# Patient Record
Sex: Male | Born: 1975 | Race: White | Hispanic: No | Marital: Single | Smoking: Light tobacco smoker
Health system: Southern US, Community
[De-identification: ages and names within clinical notes are randomized; demographics above are authoritative.]

## PROBLEM LIST (undated history)

## (undated) DIAGNOSIS — I10 Essential (primary) hypertension: Secondary | ICD-10-CM

## (undated) DIAGNOSIS — K219 Gastro-esophageal reflux disease without esophagitis: Secondary | ICD-10-CM

## (undated) HISTORY — PX: CHOLECYSTECTOMY: SHX55

---

## 2004-07-07 ENCOUNTER — Emergency Department: Payer: Self-pay | Admitting: Emergency Medicine

## 2005-07-14 ENCOUNTER — Emergency Department: Payer: Self-pay | Admitting: Emergency Medicine

## 2006-08-02 ENCOUNTER — Emergency Department: Payer: Self-pay | Admitting: Internal Medicine

## 2008-01-20 ENCOUNTER — Other Ambulatory Visit: Payer: Self-pay

## 2008-01-20 ENCOUNTER — Emergency Department: Payer: Self-pay | Admitting: Emergency Medicine

## 2008-01-21 ENCOUNTER — Emergency Department: Payer: Self-pay

## 2008-01-23 ENCOUNTER — Other Ambulatory Visit: Payer: Self-pay

## 2008-01-23 ENCOUNTER — Emergency Department: Payer: Self-pay | Admitting: Emergency Medicine

## 2008-05-12 ENCOUNTER — Emergency Department: Payer: Self-pay | Admitting: Internal Medicine

## 2008-08-12 ENCOUNTER — Emergency Department: Payer: Self-pay | Admitting: Emergency Medicine

## 2008-09-23 ENCOUNTER — Emergency Department: Payer: Self-pay | Admitting: Emergency Medicine

## 2010-01-14 ENCOUNTER — Emergency Department: Payer: Self-pay | Admitting: Emergency Medicine

## 2010-02-05 ENCOUNTER — Emergency Department: Payer: Self-pay | Admitting: Emergency Medicine

## 2010-02-10 ENCOUNTER — Emergency Department (HOSPITAL_COMMUNITY): Admission: EM | Admit: 2010-02-10 | Discharge: 2010-02-10 | Payer: Self-pay | Admitting: Emergency Medicine

## 2010-02-24 ENCOUNTER — Emergency Department: Payer: Self-pay | Admitting: Emergency Medicine

## 2010-11-03 LAB — URINALYSIS, ROUTINE W REFLEX MICROSCOPIC
Glucose, UA: NEGATIVE mg/dL
Ketones, ur: NEGATIVE mg/dL
Leukocytes, UA: NEGATIVE
Nitrite: NEGATIVE
Specific Gravity, Urine: 1.01 (ref 1.005–1.030)
Urobilinogen, UA: 0.2 mg/dL (ref 0.0–1.0)

## 2010-11-03 LAB — URINE CULTURE

## 2010-11-17 ENCOUNTER — Emergency Department: Payer: Self-pay | Admitting: Emergency Medicine

## 2011-01-27 ENCOUNTER — Emergency Department: Payer: Self-pay | Admitting: Unknown Physician Specialty

## 2011-04-09 ENCOUNTER — Emergency Department: Payer: Self-pay | Admitting: *Deleted

## 2011-08-15 ENCOUNTER — Emergency Department: Payer: Self-pay | Admitting: *Deleted

## 2011-09-16 ENCOUNTER — Emergency Department: Payer: Self-pay | Admitting: Emergency Medicine

## 2011-10-26 ENCOUNTER — Emergency Department: Payer: Self-pay | Admitting: *Deleted

## 2012-05-10 ENCOUNTER — Emergency Department: Payer: Self-pay | Admitting: Emergency Medicine

## 2013-01-16 ENCOUNTER — Emergency Department: Payer: Self-pay | Admitting: Internal Medicine

## 2013-01-16 LAB — COMPREHENSIVE METABOLIC PANEL
Albumin: 4.1 g/dL (ref 3.4–5.0)
Alkaline Phosphatase: 92 U/L (ref 50–136)
Anion Gap: 5 — ABNORMAL LOW (ref 7–16)
BUN: 8 mg/dL (ref 7–18)
Bilirubin,Total: 0.4 mg/dL (ref 0.2–1.0)
Calcium, Total: 9.4 mg/dL (ref 8.5–10.1)
Chloride: 101 mmol/L (ref 98–107)
Co2: 29 mmol/L (ref 21–32)
EGFR (African American): >60
EGFR (Non-African Amer.): >60
Glucose: 89 mg/dL (ref 65–99)
Osmolality: 268 (ref 275–301)
SGPT (ALT): 33 U/L (ref 12–78)
Sodium: 135 mmol/L — ABNORMAL LOW (ref 136–145)
Total Protein: 7.8 g/dL (ref 6.4–8.2)

## 2013-01-16 LAB — CBC
HCT: 41.9 % (ref 40.0–52.0)
MCHC: 34.6 g/dL (ref 32.0–36.0)
WBC: 11 10*3/uL — ABNORMAL HIGH (ref 3.8–10.6)

## 2013-05-13 IMAGING — CR RIGHT TIBIA AND FIBULA - 2 VIEW
1 series · 3 of 3 positions shown · non-contrast
Comparison: none

REASON FOR EXAM: fall, pain
COMMENTS:

[Series 1: x tib-fib ap right · 0.14mm/px · 3 of 3 slices shown]
[im 1/3]
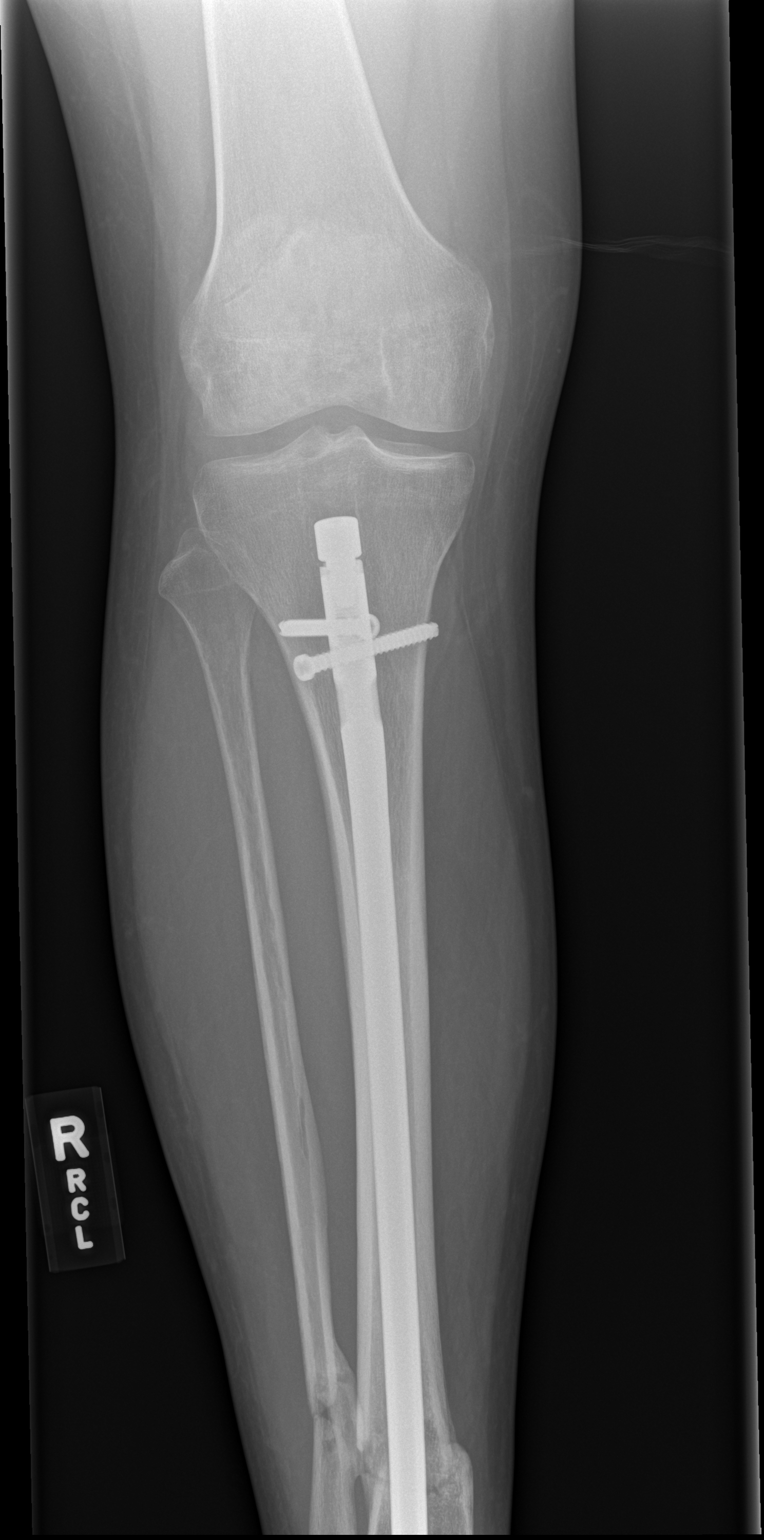
[im 2/3]
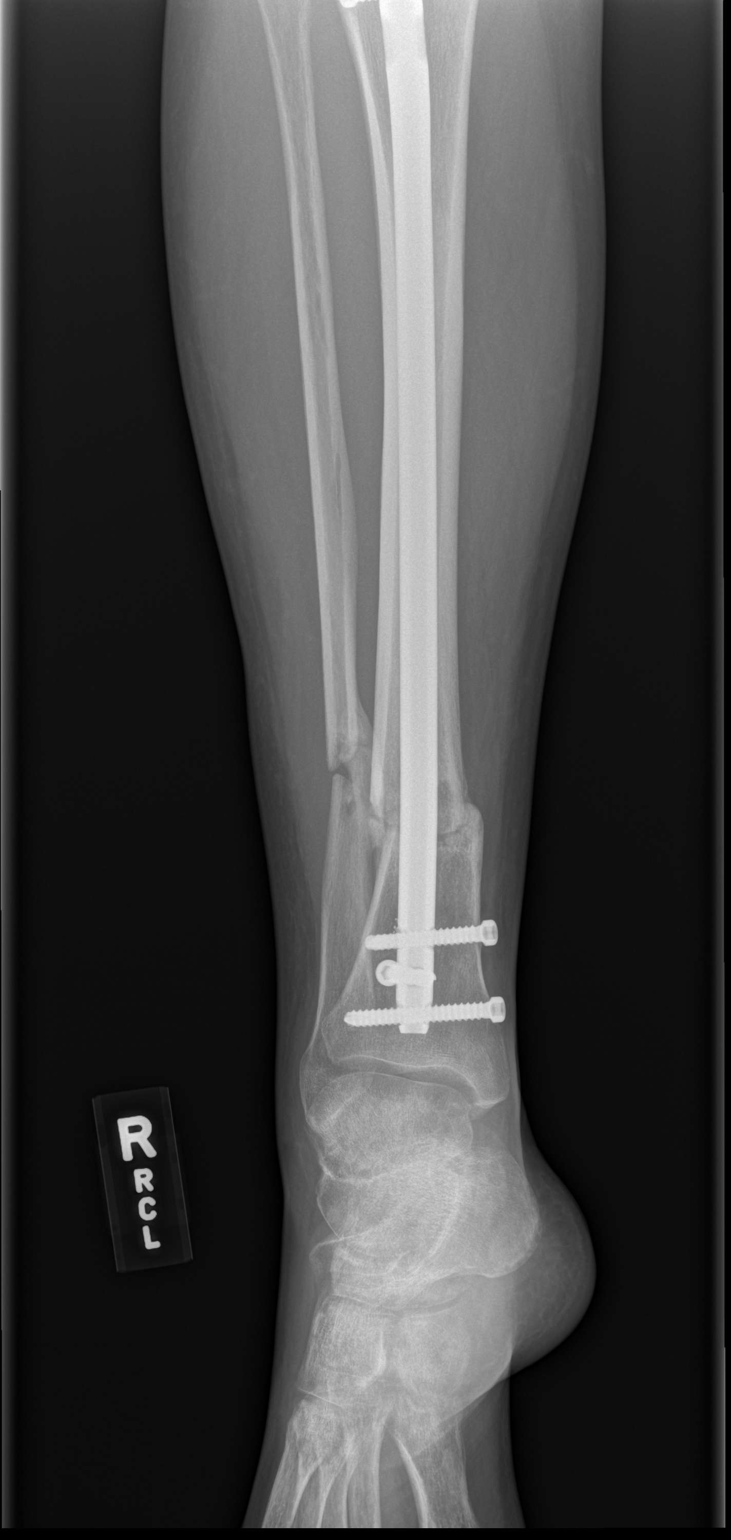
[im 3/3]
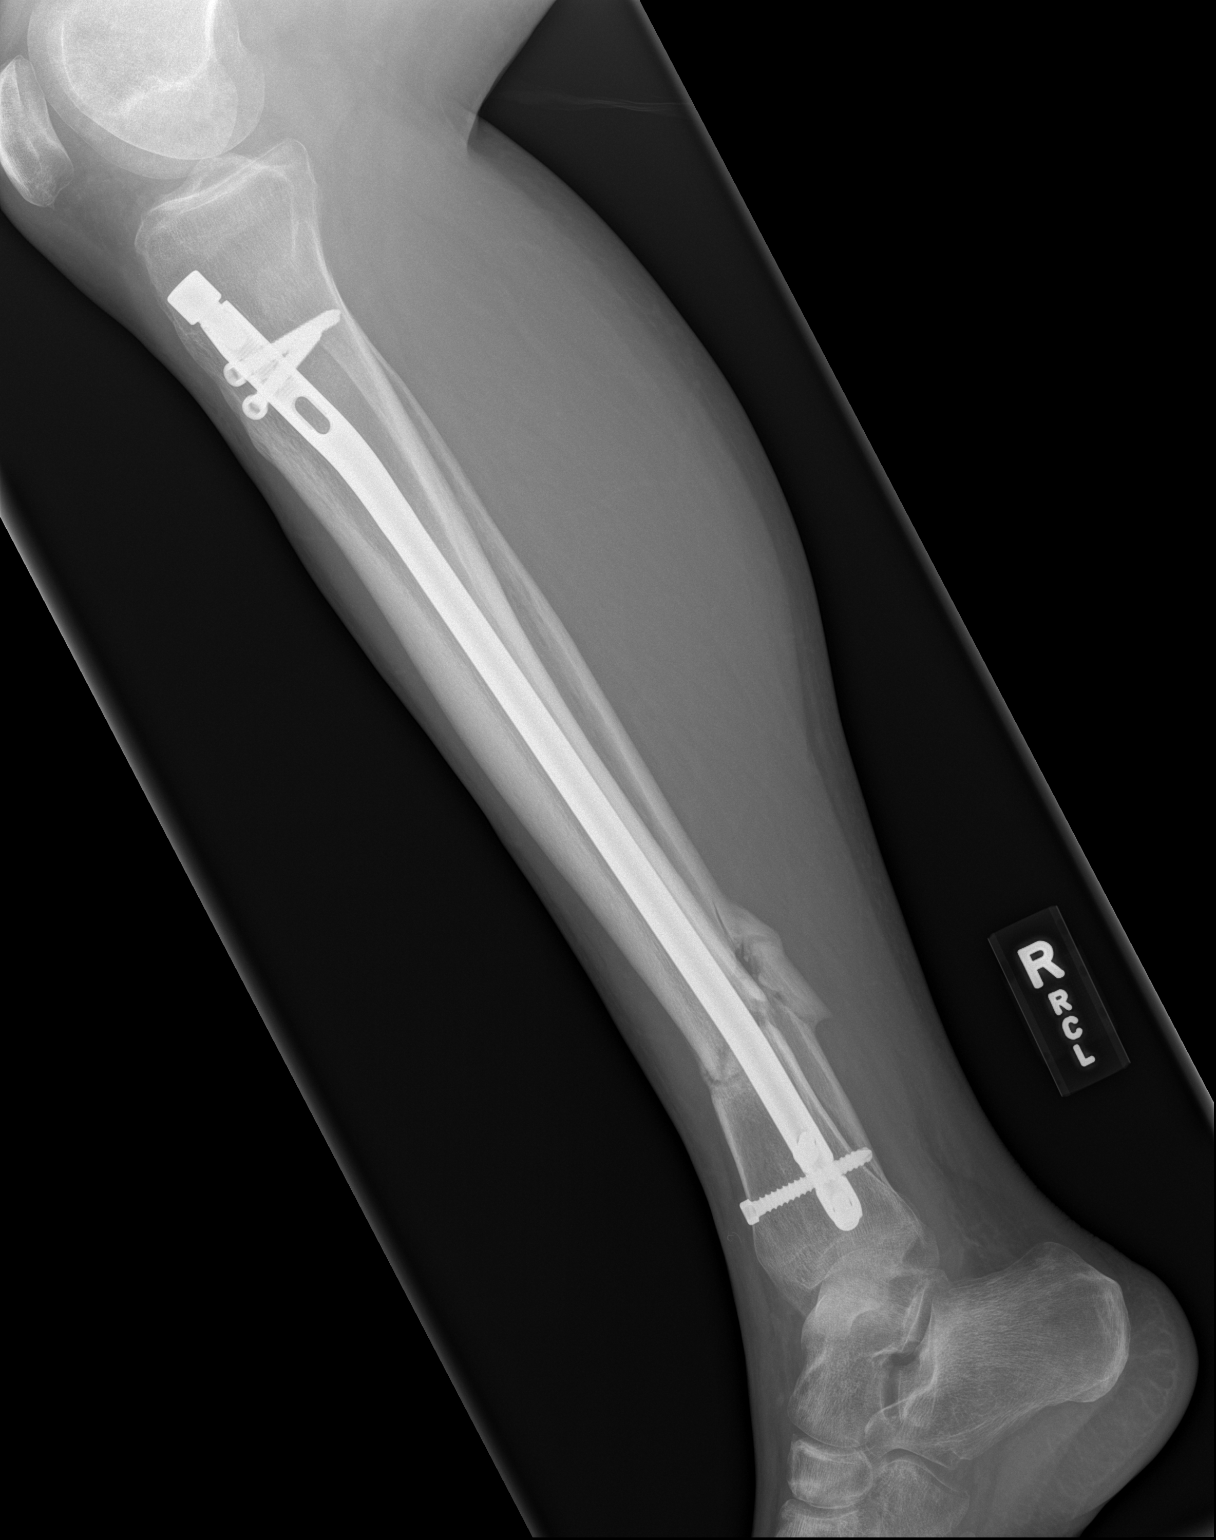

[3 of 3 positions shown; findings below may reference images not displayed]

RESULT:     AP and lateral views of the right tibia and fibula reveal the
patient has undergone previous ORIF for a distal tibial shaft fracture.
There is displacement at the fracture site which may be old or new. Did
fracture line remains visible. There is a fracture of the adjacent distal
fibular shaft. I do not see significant periosteal reaction. I do not have
prior films with which to compare.
IMPRESSION: There are fractures of the distal tibial and fibular
shafts. The patient is undergone previous ORIF for a tibial fracture and the
possibility of refracture versus and an as yet ununited fracture of both
bones is raised. More proximally the bones appear intact.

## 2013-06-10 ENCOUNTER — Emergency Department: Payer: Self-pay | Admitting: Emergency Medicine

## 2015-09-05 ENCOUNTER — Encounter: Payer: Self-pay | Admitting: *Deleted

## 2015-09-05 ENCOUNTER — Emergency Department: Payer: Self-pay

## 2015-09-05 ENCOUNTER — Emergency Department
Admission: EM | Admit: 2015-09-05 | Discharge: 2015-09-05 | Disposition: A | Discharge status: Home / Self Care | Payer: Self-pay | Attending: Emergency Medicine | Admitting: Emergency Medicine

## 2015-09-05 DIAGNOSIS — M659 Synovitis and tenosynovitis, unspecified: Secondary | ICD-10-CM | POA: Insufficient documentation

## 2015-09-05 MED ORDER — NAPROXEN 500 MG PO TBEC: 500.0000 mg | DELAYED_RELEASE_TABLET | Freq: Two times a day (BID) | ORAL | Status: DC

## 2015-09-05 MED ORDER — HYDROCODONE-ACETAMINOPHEN 5-325 MG PO TABS: 1.0000 | ORAL_TABLET | Freq: Three times a day (TID) | ORAL | Status: DC | PRN

## 2015-09-05 NOTE — ED Provider Notes (Signed)
[facility] Emergency Department Provider Note ____________________________________________  Time seen: 2145  I have reviewed the triage vital signs and the nursing notes.  HISTORY  Chief Complaint  Arm Injury  HPI Carl Parrish is a 40 y.o. male presents to the ED for evaluation of pain and swelling to the thumb side of his wrist and hand. It is Monday. 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 His activities included hammering on Monday and then by Tuesday and today he began using a large pry bar to remove wooden floor planks. He noted increased pain, tenderness, and disability with moving his thumb and wrist with work activities. He denies any other injury at this time. He rates the pain to his forearm and wrist at a 10/10 in triage.  No past medical history on file.  There are no active problems to display for this patient.  No past surgical history on file.  Current Outpatient Rx  Name  Route  Sig  Dispense  Refill  . HYDROcodone-acetaminophen (NORCO) 5-325 MG tablet   Oral   Take 1 tablet by mouth every 8 (eight) hours as needed for moderate pain.   10 tablet   0   . naproxen (EC NAPROSYN) 500 MG EC tablet   Oral   Take 1 tablet (500 mg total) by mouth 2 (two) times daily with a meal.   30 tablet   0     Allergies Review of patient's allergies indicates no known allergies.  No family history on file.  Social History Social History  Substance Use Topics  . Smoking status: Never Smoker   . Smokeless tobacco: Not on file  . Alcohol Use: Yes   Review of Systems  Constitutional: Negative for fever. Eyes: Negative for visual changes. ENT: Negative for sore throat. Cardiovascular: Negative for chest pain. Respiratory: Negative for shortness of breath. Gastrointestinal: Negative for abdominal pain, vomiting and diarrhea. Genitourinary: Negative for  dysuria. Musculoskeletal: Negative for back pain. Left forearm pain as above. Skin: Negative for rash. Neurological: Negative for headaches, focal weakness or numbness. ____________________________________________  PHYSICAL EXAM:  VITAL SIGNS: ED Triage Vitals  Enc Vitals Group     BP 09/05/15 2107 141/100 mmHg     Pulse Rate 09/05/15 2105 88     Resp 09/05/15 2105 20     Temp 09/05/15 2105 98.5 F (36.9 C)     Temp Source 09/05/15 2105 Oral     SpO2 09/05/15 2105 99 %     Weight 09/05/15 2105 180 lb (81.647 kg)     Height 09/05/15 2105  (1.676 m)     Head Cir --      Peak Flow --      Pain Score 09/05/15 2106 10     Pain Loc --      Pain Edu? --      Excl. in GC? --    Constitutional: Alert and oriented. Well appearing and in no distress. Head: Normocephalic and atraumatic.      Eyes: Conjunctivae are normal. PERRL. Normal extraocular movements      Ears: Canals clear. TMs intact bilaterally.   Nose: No congestion/rhinorrhea.   Mouth/Throat: Mucous membranes are moist.   Neck: Supple. No thyromegaly. Hematological/Lymphatic/Immunological: No cervical lymphadenopathy. Cardiovascular: Normal rate, regular rhythm.  Respiratory: Normal respiratory effort. No wheezes/rales/rhonchi. Gastrointestinal: Soft and nontender. No distention. Musculoskeletal: Left wrist and thumb with no obvious deformity. Patient  does have some appreciable soft tissue swelling noted to the lateral aspect of wrist over the EPL tendon. Patient is tender to palpation in the same region. He also has a positive Finkelstein on exam.  Nontender with normal range of motion in all extremities.  Neurologic: Cranial nerves II through XII grossly intact. Patient is able demonstrate normal intrinsic and opposition testing. Normal gait without ataxia. Normal speech and language. No gross focal neurologic deficits are appreciated. Skin:  Skin is warm, dry and intact. No rash noted. Psychiatric: Mood and  affect are normal. Patient exhibits appropriate insight and judgment. ____________________________________________  PROCEDURES  Thumb Spica OCL splint ____________________________________________  INITIAL IMPRESSION / ASSESSMENT AND PLAN / ED COURSE  Patient with a decreasing her veins tenosynovitis of the EPL tendon. Symptoms are consistent with his repetitive motion injury during his construction activities for the week. He is fitted with an OCL thumb spica splint for support. He is also provided with a order for a Velcro thumb spica splint as needed. He will continue to apply ice and monitor activities. He is discharged with a prescription for EC Naprosyn and # 10 Norco. He is to follow-up with Dr. Joice Lofts for ongoing symptoms and further management. ____________________________________________  FINAL CLINICAL IMPRESSION(S) / ED DIAGNOSES  Final diagnoses:  Tenosynovitis of thumb      Lissa Hoard, PA-C 09/05/15 2251  Sharman Cheek, MD 09/05/15 2316

## 2015-09-05 NOTE — ED Notes (Signed)
Pt has right arm pain for 3 days.  Swelling noted.  Pt unsure what happened.   Pt does roofing work.  States not WC

## 2015-09-05 NOTE — ED Notes (Signed)
Pt c/o pain to R forearm.  Pt sts that he does not remember any injury but susopect he might have hit himself in arm w/ crowbar at work,

## 2015-09-05 NOTE — Discharge Instructions (Signed)
Tendinitis and Tenosynovitis  °Tendinitis is inflammation of the tendon. Tenosynovitis is inflammation of the lining around the tendon (tendon sheath). These painful conditions often occur at once. Tendons attach muscle to bone. To move a limb, force from the muscle moves through the tendon, to the bone. These conditions often cause increased pain when moving. Tendinitis may be caused by a small or partial tear in the tendon.  °SYMPTOMS  °· Pain, tenderness, redness, bruising, or swelling at the injury. °· Loss of normal joint movement. °· Pain that gets worse with use of the muscle and joint attached to the tendon. °· Weakness in the tendon, caused by calcium build up that may occur with tendinitis. °· Commonly affected tendons: °¨ Achilles tendon (calf of leg). °¨ Rotator cuff (shoulder joint). °¨ Patellar tendon (kneecap to shin). °¨ Peroneal tendon (ankle). °¨ Posterior tibial tendon (inner ankle). °¨ Biceps tendon (in front of shoulder). °CAUSES  °· Sudden strain on a flexed muscle, muscle overuse, sudden increase or change in activity, vigorous activity. °· Result of a direct hit (less common). °· Poor muscle action (biomechanics). °RISK INCREASES WITH: °· Injury (trauma). °· Too much exercise. °· Sudden change in athletic activity. °· Incorrect exercise form or technique. °· Poor strength and flexibility. °· Not warming-up properly before activity. °· Returning to activity before healing is complete. °PREVENTION  °· Warm-up and stretch properly before activity. °· Maintain physical fitness: °¨ Joint flexibility. °¨ Muscle strength and endurance. °¨ Fitness that increases heart rate. °· Learn and use proper exercise techniques. °· Use rehabilitation exercises to strengthen weak muscles and tendons. °· Ice the tendon after activity, to reduce recurring inflammation. °· Wear proper fitting protective equipment for specific tendons, when indicated. °PROGNOSIS  °When treated properly, can be cured in 6 to 8 weeks.  Recovery may take longer, depending on degree of injury.  °RELATED COMPLICATIONS  °· Re-injury or recurring symptoms. °· Permanent weakness or joint stiffness, if injury is severe and recovery is not completed. °· Delayed healing, if sports are started before healing is complete. °· Tearing apart (rupture) of the inflamed tendon. Tendinitis means the tendon is injured and must recover. °TREATMENT  °Treatment first involves ice, medicine, and rest from aggravating activities. This reduces pain and inflammation. Modifying your activity may be considered to prevent recurring injury. A brace, elastic bandage wrap, splint, cast, or sling may be prescribed to protect the joint for a short period. After that period, strengthening and stretching exercise may help to regain strength and full range of motion. If the condition persists, despite non-surgical treatment, surgery may be recommended to remove the inflamed tendon lining. Corticosteroid injections may be given to reduce inflammation. However, these injections may weaken the tendon and increase your risk for tendon rupture. °MEDICATION  °· If pain medicine is needed, nonsteroidal anti-inflammatory medicines (aspirin and ibuprofen), or other minor pain relievers (acetaminophen), are often recommended. °· Do not take pain medicine for 7 days before surgery. °· Prescription pain relievers are usually prescribed only after surgery. Use only as directed and only as much as you need. °· Ointments applied to the skin may be helpful. °· Corticosteroid injections may be given to reduce inflammation. However, this may increase your risk of a tendon rupture. °HEAT AND COLD °· Cold treatment (icing) relieves pain and reduces inflammation. Cold treatment should be applied for 10 to 15 minutes every 2 to 3 hours, and immediately after activity that aggravates your symptoms. Use ice packs or an ice massage. °· Heat   treatment may be used before performing stretching and strengthening  activities prescribed by your caregiver, physical therapist, or athletic trainer. Use a heat pack or a warm water soak. SEEK MEDICAL CARE IF:   Symptoms get worse or do not improve, despite treatment.  Pain becomes too much to tolerate.  You develop numbness or tingling.  Toes become cold, or toenails become blue, gray, or dark colored.  New, unexplained symptoms develop. (Drugs used in treatment may produce side effects.)   This information is not intended to replace advice given to you by your health care provider. Make sure you discuss any questions you have with your health care provider.   Document Released: 08/04/2005 Document Revised: 10/27/2011 Document Reviewed: 11/16/2008 Elsevier Interactive Patient Education 2016 [employer].   Wear the thumb splint while working and during activities. Consider a velcro splint from the medical supply house.  Take the prescription meds and apply ice to reduce swelling. Follow-up with Dr. Joice Lofts for ongoing symptoms.

## 2015-11-26 ENCOUNTER — Emergency Department
Admission: EM | Admit: 2015-11-26 | Discharge: 2015-11-26 | Disposition: A | Discharge status: Home / Self Care | Payer: Self-pay | Attending: Emergency Medicine | Admitting: Emergency Medicine

## 2015-11-26 ENCOUNTER — Emergency Department: Payer: Self-pay

## 2015-11-26 DIAGNOSIS — J209 Acute bronchitis, unspecified: Secondary | ICD-10-CM | POA: Insufficient documentation

## 2015-11-26 DIAGNOSIS — R1110 Vomiting, unspecified: Secondary | ICD-10-CM | POA: Insufficient documentation

## 2015-11-26 DIAGNOSIS — R197 Diarrhea, unspecified: Secondary | ICD-10-CM | POA: Insufficient documentation

## 2015-11-26 DIAGNOSIS — Z87891 Personal history of nicotine dependence: Secondary | ICD-10-CM | POA: Insufficient documentation

## 2015-11-26 DIAGNOSIS — J40 Bronchitis, not specified as acute or chronic: Secondary | ICD-10-CM

## 2015-11-26 MED ORDER — PREDNISONE 10 MG (21) PO TBPK: ORAL_TABLET | ORAL | Status: DC

## 2015-11-26 MED ORDER — IPRATROPIUM-ALBUTEROL 0.5-2.5 (3) MG/3ML IN SOLN
3.0000 mL | Freq: Once | RESPIRATORY_TRACT | Status: AC
  Administered 2015-11-26: 3 mL via RESPIRATORY_TRACT

## 2015-11-26 MED ORDER — IPRATROPIUM-ALBUTEROL 0.5-2.5 (3) MG/3ML IN SOLN
RESPIRATORY_TRACT | Status: AC
  Administered 2015-11-26: 3 mL via RESPIRATORY_TRACT
  Filled 2015-11-26: qty 3

## 2015-11-26 MED ORDER — HYDROCOD POLST-CPM POLST ER 10-8 MG/5ML PO SUER: 5.0000 mL | Freq: Two times a day (BID) | ORAL | Status: DC

## 2015-11-26 MED ORDER — PREDNISONE 20 MG PO TABS
60.0000 mg | ORAL_TABLET | Freq: Once | ORAL | Status: AC
  Administered 2015-11-26: 60 mg via ORAL

## 2015-11-26 MED ORDER — IPRATROPIUM-ALBUTEROL 0.5-2.5 (3) MG/3ML IN SOLN
3.0000 mL | Freq: Once | RESPIRATORY_TRACT | Status: AC
  Administered 2015-11-26: 3 mL via RESPIRATORY_TRACT
  Filled 2015-11-26: qty 3

## 2015-11-26 MED ORDER — PREDNISONE 20 MG PO TABS
ORAL_TABLET | ORAL | Status: AC
  Administered 2015-11-26: 60 mg via ORAL
  Filled 2015-11-26: qty 3

## 2015-11-26 MED ORDER — AZITHROMYCIN 250 MG PO TABS: ORAL_TABLET | ORAL | Status: DC

## 2015-11-26 MED ORDER — ALBUTEROL SULFATE HFA 108 (90 BASE) MCG/ACT IN AERS: 2.0000 | INHALATION_SPRAY | Freq: Four times a day (QID) | RESPIRATORY_TRACT | Status: DC | PRN

## 2015-11-26 NOTE — Discharge Instructions (Signed)

## 2015-11-26 NOTE — ED Notes (Addendum)
Pt c/o cough, sinus and chest congestion for the past 3 weeks, states since thursday evening had V/D.Carl Parrish. Denies vomiting today.. Pt is in NAD on arrival

## 2015-11-26 NOTE — ED Provider Notes (Signed)
[facility] Emergency Department Provider Note     Time seen: ----------------------------------------- 6:25 PM on 11/26/2015 -----------------------------------------    I have reviewed the triage vital signs and the nursing notes.   HISTORY  Chief Complaint Nasal Congestion; Cough; Emesis; and Diarrhea    HPI Carl Parrish is a 39 y.o. male who presents ER for cough, sinus congestion and chest pain with cough for the last 3 weeks. Patient states since Thursday evenings of vomiting and diarrhea. He denies vomiting today. Nothing makes symptoms better or worse, he denies history of same, does not smoke.   History reviewed. No pertinent past medical history.  There are no active problems to display for this patient.   Past Surgical History  Procedure Laterality Date  . Cholecystectomy      Allergies Review of patient's allergies indicates no known allergies.  Social History Social History  Substance Use Topics  . Smoking status: Former Games developer  . Smokeless tobacco: None  . Alcohol Use: Yes    Review of Systems Constitutional: Negative for fever. Eyes: Negative for visual changes. ENT: Negative for sore throat. Cardiovascular: Positive for chest pain with cough Respiratory: Positive for cough and congestion Gastrointestinal: Negative for abdominal pain, positive for recent vomiting and diarrhea Genitourinary: Negative for dysuria. Musculoskeletal: Negative for back pain. Skin: Negative for rash. Neurological: Negative for headaches, focal weakness or numbness.  10-point ROS otherwise negative.  ____________________________________________   PHYSICAL EXAM:  VITAL SIGNS: ED Triage Vitals  Enc Vitals Group     BP 11/26/15 1651 155/95 mmHg     Pulse Rate 11/26/15 1651 94     Resp 11/26/15 1651 20     Temp 11/26/15 1651 98 F (36.7 C)     Temp Source 11/26/15 1651 Oral     SpO2 11/26/15 1651 99 %     Weight 11/26/15 1651 175 lb  (79.379 kg)     Height 11/26/15 1651  (1.676 m)     Head Cir --      Peak Flow --      Pain Score 11/26/15 1652 10     Pain Loc --      Pain Edu? --      Excl. in GC? --     Constitutional: Alert and oriented. Well appearing and in no distress. Eyes: Conjunctivae are normal. PERRL. Normal extraocular movements. ENT   Head: Normocephalic and atraumatic.   Nose: No congestion/rhinnorhea.   Mouth/Throat: Mucous membranes are moist.   Neck: No stridor. Cardiovascular: Normal rate, regular rhythm. No murmurs, rubs, or gallops. Respiratory: Normal respiratory effort without tachypnea nor retractions, bilateral wheezing and rhonchi that is mild Gastrointestinal: Soft and nontender. Normal bowel sounds Musculoskeletal: Nontender with normal range of motion in all extremities. No lower extremity tenderness nor edema. Neurologic:  Normal speech and language. No gross focal neurologic deficits are appreciated.  Skin:  Skin is warm, dry and intact. No rash noted. Psychiatric: Mood and affect are normal. Speech and behavior are normal.  ____________________________________________  ED COURSE:  Pertinent labs & imaging results that were available during my care of the patient were reviewed by me and considered in my medical decision making (see chart for details). Patient is in no acute distress, clinically has bronchitis. I have given a DuoNeb and prednisone and we will reevaluate. ____________________________________________  RADIOLOGY  Chest x-ray Is unremarkable ____________________________________________  FINAL ASSESSMENT AND PLAN  Bronchitis  Plan: Patient with imaging as dictated above. Patient was given duo nebs  and prednisone here. He'll be discharged with albuterol, prednisone taper, Z-Pak and Tussionex given 3 weeks of symptoms. He is referred to internal medicine for follow-up.   Emily FilbertWilliams, Supriya Beaston E, MD   Emily FilbertJonathan E Larri Yehle, MD 11/26/15 [phone]

## 2016-04-04 ENCOUNTER — Inpatient Hospital Stay
Admission: EM | Admit: 2016-04-04 | Discharge: 2016-04-05 | DRG: 683 | Payer: Self-pay | Attending: Internal Medicine | Admitting: Internal Medicine

## 2016-04-04 ENCOUNTER — Emergency Department: Payer: Self-pay

## 2016-04-04 ENCOUNTER — Inpatient Hospital Stay: Payer: Self-pay

## 2016-04-04 DIAGNOSIS — K219 Gastro-esophageal reflux disease without esophagitis: Secondary | ICD-10-CM | POA: Diagnosis present

## 2016-04-04 DIAGNOSIS — R000 Tachycardia, unspecified: Secondary | ICD-10-CM

## 2016-04-04 DIAGNOSIS — R45851 Suicidal ideations: Secondary | ICD-10-CM | POA: Diagnosis present

## 2016-04-04 DIAGNOSIS — F419 Anxiety disorder, unspecified: Secondary | ICD-10-CM

## 2016-04-04 DIAGNOSIS — I10 Essential (primary) hypertension: Secondary | ICD-10-CM | POA: Diagnosis present

## 2016-04-04 DIAGNOSIS — F332 Major depressive disorder, recurrent severe without psychotic features: Secondary | ICD-10-CM

## 2016-04-04 DIAGNOSIS — N12 Tubulo-interstitial nephritis, not specified as acute or chronic: Secondary | ICD-10-CM | POA: Diagnosis present

## 2016-04-04 DIAGNOSIS — F1410 Cocaine abuse, uncomplicated: Secondary | ICD-10-CM | POA: Diagnosis present

## 2016-04-04 DIAGNOSIS — D72829 Elevated white blood cell count, unspecified: Secondary | ICD-10-CM

## 2016-04-04 DIAGNOSIS — E876 Hypokalemia: Secondary | ICD-10-CM | POA: Diagnosis present

## 2016-04-04 DIAGNOSIS — F17200 Nicotine dependence, unspecified, uncomplicated: Secondary | ICD-10-CM | POA: Diagnosis present

## 2016-04-04 DIAGNOSIS — N19 Unspecified kidney failure: Secondary | ICD-10-CM

## 2016-04-04 DIAGNOSIS — G8929 Other chronic pain: Secondary | ICD-10-CM | POA: Diagnosis present

## 2016-04-04 DIAGNOSIS — E860 Dehydration: Secondary | ICD-10-CM | POA: Diagnosis present

## 2016-04-04 DIAGNOSIS — Z9049 Acquired absence of other specified parts of digestive tract: Secondary | ICD-10-CM

## 2016-04-04 DIAGNOSIS — N179 Acute kidney failure, unspecified: Secondary | ICD-10-CM | POA: Diagnosis present

## 2016-04-04 DIAGNOSIS — T39312A Poisoning by propionic acid derivatives, intentional self-harm, initial encounter: Secondary | ICD-10-CM | POA: Diagnosis present

## 2016-04-04 DIAGNOSIS — T1491XA Suicide attempt, initial encounter: Secondary | ICD-10-CM

## 2016-04-04 DIAGNOSIS — F1994 Other psychoactive substance use, unspecified with psychoactive substance-induced mood disorder: Secondary | ICD-10-CM

## 2016-04-04 DIAGNOSIS — Z8261 Family history of arthritis: Secondary | ICD-10-CM

## 2016-04-04 DIAGNOSIS — F1420 Cocaine dependence, uncomplicated: Secondary | ICD-10-CM

## 2016-04-04 DIAGNOSIS — N170 Acute kidney failure with tubular necrosis: Principal | ICD-10-CM | POA: Diagnosis present

## 2016-04-04 DIAGNOSIS — R809 Proteinuria, unspecified: Secondary | ICD-10-CM | POA: Diagnosis present

## 2016-04-04 HISTORY — DX: Gastro-esophageal reflux disease without esophagitis: K21.9

## 2016-04-04 HISTORY — DX: Essential (primary) hypertension: I10

## 2016-04-04 LAB — COMPREHENSIVE METABOLIC PANEL
ALBUMIN: 4.9 g/dL (ref 3.5–5.0)
ALK PHOS: 71 U/L (ref 38–126)
ALT: 66 U/L — ABNORMAL HIGH (ref 17–63)
ANION GAP: 15 (ref 5–15)
AST: 74 U/L — ABNORMAL HIGH (ref 15–41)
BILIRUBIN TOTAL: 0.9 mg/dL (ref 0.3–1.2)
BUN: 24 mg/dL — ABNORMAL HIGH (ref 6–20)
CALCIUM: 9.5 mg/dL (ref 8.9–10.3)
CO2: 21 mmol/L — ABNORMAL LOW (ref 22–32)
Chloride: 100 mmol/L — ABNORMAL LOW (ref 101–111)
Creatinine, Ser: 4.85 mg/dL — ABNORMAL HIGH (ref 0.61–1.24)
GFR calc non Af Amer: 14 mL/min — ABNORMAL LOW (ref >60)
GFR, EST AFRICAN AMERICAN: 16 mL/min — AB (ref >60)
GLUCOSE: 172 mg/dL — AB (ref 65–99)
Potassium: 3.1 mmol/L — ABNORMAL LOW (ref 3.5–5.1)
Sodium: 136 mmol/L (ref 135–145)
TOTAL PROTEIN: 8.7 g/dL — AB (ref 6.5–8.1)

## 2016-04-04 LAB — CBC
HCT: 48 % (ref 40.0–52.0)
Hemoglobin: 16.3 g/dL (ref 13.0–18.0)
MCH: 28 pg (ref 26.0–34.0)
MCHC: 34 g/dL (ref 32.0–36.0)
MCV: 82.3 fL (ref 80.0–100.0)
PLATELETS: 334 10*3/uL (ref 150–440)
RBC: 5.83 MIL/uL (ref 4.40–5.90)
RDW: 13.6 % (ref 11.5–14.5)
WBC: 33.3 10*3/uL — AB (ref 3.8–10.6)

## 2016-04-04 LAB — URINE DRUG SCREEN, QUALITATIVE (ARMC ONLY)
Amphetamines, Ur Screen: NOT DETECTED
Barbiturates, Ur Screen: NOT DETECTED
Benzodiazepine, Ur Scrn: NOT DETECTED
CANNABINOID 50 NG, UR ~~LOC~~: NOT DETECTED
COCAINE METABOLITE, UR ~~LOC~~: POSITIVE — AB
MDMA (ECSTASY) UR SCREEN: NOT DETECTED
Methadone Scn, Ur: NOT DETECTED
Opiate, Ur Screen: POSITIVE — AB
Phencyclidine (PCP) Ur S: NOT DETECTED
TRICYCLIC, UR SCREEN: NOT DETECTED

## 2016-04-04 LAB — URINALYSIS COMPLETE WITH MICROSCOPIC (ARMC ONLY)
Bilirubin Urine: NEGATIVE
GLUCOSE, UA: NEGATIVE mg/dL
KETONES UR: NEGATIVE mg/dL
Leukocytes, UA: NEGATIVE
Nitrite: NEGATIVE
Protein, ur: 100 mg/dL — AB
SPECIFIC GRAVITY, URINE: 1.025 (ref 1.005–1.030)
pH: 5 (ref 5.0–8.0)

## 2016-04-04 LAB — SALICYLATE LEVEL: Salicylate Lvl: <4 mg/dL (ref 2.8–30.0)

## 2016-04-04 LAB — ACETAMINOPHEN LEVEL

## 2016-04-04 LAB — ETHANOL: Alcohol, Ethyl (B): <5 mg/dL (ref <5)

## 2016-04-04 LAB — CHLAMYDIA/NGC RT PCR (ARMC ONLY)
CHLAMYDIA TR: NOT DETECTED
N GONORRHOEAE: NOT DETECTED

## 2016-04-04 LAB — CK: CK TOTAL: 106 U/L (ref 49–397)

## 2016-04-04 MED ORDER — ACETAMINOPHEN 325 MG PO TABS
650.0000 mg | ORAL_TABLET | Freq: Four times a day (QID) | ORAL | Status: DC | PRN
  Administered 2016-04-05: 15:00:00 650 mg via ORAL
  Filled 2016-04-04: qty 2

## 2016-04-04 MED ORDER — TRAMADOL HCL 50 MG PO TABS
50.0000 mg | ORAL_TABLET | Freq: Two times a day (BID) | ORAL | Status: DC | PRN
  Administered 2016-04-04 – 2016-04-05 (×2): 50 mg via ORAL
  Filled 2016-04-04 (×3): qty 1

## 2016-04-04 MED ORDER — SODIUM CHLORIDE 0.9 % IV BOLUS (SEPSIS)
1000.0000 mL | Freq: Once | INTRAVENOUS | Status: AC
  Administered 2016-04-04: 1000 mL via INTRAVENOUS

## 2016-04-04 MED ORDER — HEPARIN SODIUM (PORCINE) 5000 UNIT/ML IJ SOLN
5000.0000 [IU] | Freq: Three times a day (TID) | INTRAMUSCULAR | Status: DC
  Administered 2016-04-04 – 2016-04-05 (×4): 5000 [IU] via SUBCUTANEOUS
  Filled 2016-04-04 (×4): qty 1

## 2016-04-04 MED ORDER — POTASSIUM CHLORIDE CRYS ER 20 MEQ PO TBCR
20.0000 meq | EXTENDED_RELEASE_TABLET | Freq: Once | ORAL | Status: AC
  Administered 2016-04-04: 20 meq via ORAL
  Filled 2016-04-04: qty 1

## 2016-04-04 MED ORDER — ACETAMINOPHEN 650 MG RE SUPP: 650.0000 mg | Freq: Four times a day (QID) | RECTAL | Status: DC | PRN

## 2016-04-04 MED ORDER — ONDANSETRON HCL 4 MG PO TABS: 4.0000 mg | ORAL_TABLET | Freq: Four times a day (QID) | ORAL | Status: DC | PRN

## 2016-04-04 MED ORDER — LORAZEPAM 2 MG/ML IJ SOLN
1.0000 mg | Freq: Once | INTRAMUSCULAR | Status: AC
  Administered 2016-04-04: 1 mg via INTRAVENOUS
  Filled 2016-04-04: qty 1

## 2016-04-04 MED ORDER — SODIUM CHLORIDE 0.9 % IV SOLN
INTRAVENOUS | Status: DC
  Administered 2016-04-04 – 2016-04-05 (×4): via INTRAVENOUS

## 2016-04-04 MED ORDER — ONDANSETRON HCL 4 MG/2ML IJ SOLN: 4.0000 mg | Freq: Four times a day (QID) | INTRAMUSCULAR | Status: DC | PRN

## 2016-04-04 MED ORDER — SENNOSIDES-DOCUSATE SODIUM 8.6-50 MG PO TABS: 1.0000 | ORAL_TABLET | Freq: Every evening | ORAL | Status: DC | PRN
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
## 2016-04-04 NOTE — BH Assessment (Signed)
Assessment Note  Carl Parrish is an 40 y.o. male who presents to the ER seeking help for his substance use. He admits to using; cocaine, alcohol and cannabis. While in the ER, he reported he took an overdose of Aleve, this past Wednesday (04/02/2016). He states he was having SI and didn't "give a fuck about my life." Per his report, his family stated he "wasn't acting right." He was naked and trying to fight people. Patient did not seek medical attention after the attempt.  Due to the overdose, the patient will be admitted to the medical floor. His lab work resulted outside the normal ranges. It was to the degree of him needing additional medical attention.   Patient still endorse depression and currently vague about SI. He states he is tired of current lifestyle and want to make changes. "I need to get sober and leave drugs alone."   Patient denies HI and AV/H.  Diagnosis: Depression & Substance Abuse  Past Medical History:  Past Medical History:  Diagnosis Date  . GERD (gastroesophageal reflux disease)   . Hypertension     Past Surgical History:  Procedure Laterality Date  . CHOLECYSTECTOMY      Family History:  Family History  Problem Relation Age of Onset  . Arthritis Mother     Social History:  reports that he has been smoking.  He has never used smokeless tobacco. He reports that he drinks alcohol. He reports that he uses drugs, including Marijuana and Cocaine.  Additional Social History:  Alcohol / Drug Use Pain Medications: See PTA Prescriptions: See PTA Over the Counter: See PTA History of alcohol / drug use?: Yes Negative Consequences of Use: Financial, Personal relationships, Work / Programmer, multimediachool Withdrawal Symptoms:  (Reports of none) Substance #1 Name of Substance 1: Alcohol 1 - Age of First Use: 17 1 - Amount (size/oz): 24 oz beer 1 - Frequency: Daily 1 - Duration: Last two weeks 1 - Last Use / Amount: 04/03/2016 Substance #2 Name of Substance 2: Cocaine 2 -  Age of First Use: 19 2 - Amount (size/oz): Unable to quantify 2 - Frequency: "Last couple of weeks." 2 - Duration: "About a 6 months." 2 - Last Use / Amount: 04/03/2016 Substance #3 Name of Substance 3: Pain Pills 3 - Age of First Use: 20 3 - Amount (size/oz): Unable to quantify 3 - Frequency: Daily 3 - Duration: Unable to quantify 3 - Last Use / Amount: 04/03/2016  CIWA: CIWA-Ar BP: (!) 114/97 Pulse Rate: 92 COWS:    Allergies: No Known Allergies  Home Medications:  (Not in a hospital admission)  OB/GYN Status:  No LMP for male patient.  General Assessment Data [facility] Assessment: In system Is this a Tele or Face-to-Face Assessment?: Face-to-Face Is this an Initial Assessment or a Re-assessment for this encounter?: Initial Assessment Marital status: Long term relationship Maiden name: n/a Is patient pregnant?: No Pregnancy Status: No Living Arrangements: Other relatives (lives with aunt) Can pt return to current living arrangement?: Yes Admission Status: Voluntary Is patient capable of signing voluntary admission?: Yes Referral Source: Self/Family/Friend Insurance type: None  Medical Screening Exam [facility] ONLY) Medical Exam completed: Yes  Crisis Care Plan Living Arrangements: Other relatives (lives with aunt) Legal Guardian: Other: (None) Name of Psychiatrist: Reports of none Name of Therapist: Reports of none  Education Status Is patient currently in school?: No Current Grade: n/a Highest grade of school patient has completed: 11th Grade Name of school: n/a  Contact person: n/a  Risk to self with the past 6 months Suicidal Ideation: No-Not Currently/Within Last 6 Months Has patient been a risk to self within the past 6 months prior to admission? : Yes Suicidal Intent: No-Not Currently/Within Last 6 Months Has patient had any suicidal intent within the past 6 months prior to admission? : Yes Is patient at risk for  suicide?: Yes Suicidal Plan?: No-Not Currently/Within Last 6 Months Has patient had any suicidal plan within the past 6 months prior to admission? : Yes Access to Means: Yes Specify Access to Suicidal Means: Overdose on medications What has been your use of drugs/alcohol within the last 12 months?: Alcohol, Cocaine and Cannabis Previous Attempts/Gestures: Yes How many times?: 1 Other Self Harm Risks: Active Addiction Triggers for Past Attempts: None known Family Suicide History: No Recent stressful life event(s): Trauma (Comment), Conflict (Comment) Persecutory voices/beliefs?: No Depression: Yes Depression Symptoms: Feeling angry/irritable, Loss of interest in usual pleasures, Feeling worthless/self pity, Guilt, Fatigue, Isolating Substance abuse history and/or treatment for substance abuse?: No Suicide prevention information given to non-admitted patients: Not applicable  Risk to Others within the past 6 months Homicidal Ideation: No Does patient have any lifetime risk of violence toward others beyond the six months prior to admission? : No Thoughts of Harm to Others: No Current Homicidal Intent: No Current Homicidal Plan: No Access to Homicidal Means: No Identified Victim: Reports of none History of harm to others?: No Assessment of Violence: None Noted Violent Behavior Description: Reports of none Does patient have access to weapons?: No Criminal Charges Pending?: No Does patient have a court date: No Is patient on probation?: No  Psychosis Hallucinations: None noted Delusions: None noted  Mental Status Report Appearance/Hygiene: In hospital gown, In scrubs, Unremarkable Eye Contact: Good Motor Activity: Freedom of movement, Unremarkable Speech: Logical/coherent, Unremarkable Level of Consciousness: Alert Mood: Depressed, Anxious, Pleasant Affect: Appropriate to circumstance, Depressed Anxiety Level: None Thought Processes: Coherent, Relevant Judgement:  Unimpaired Orientation: Person, Place, Time, Situation, Appropriate for developmental age Obsessive Compulsive Thoughts/Behaviors: None  Cognitive Functioning Concentration: Normal Memory: Recent Intact, Remote Intact IQ: Average Insight: Fair Impulse Control: Poor Appetite: Good Weight Loss: 0 Weight Gain: 0 Sleep: No Change Total Hours of Sleep: 3 (Ongoing for approximately 5 years) Vegetative Symptoms: None  ADLScreening [facility] Assessment Services) Patient's cognitive ability adequate to safely complete daily activities?: Yes Patient able to express need for assistance with ADLs?: Yes Independently performs ADLs?: Yes (appropriate for developmental age)  Prior Inpatient Therapy Prior Inpatient Therapy: No Prior Therapy Dates: Reports of none Prior Therapy Facilty/Provider(s): Reports of none Reason for Treatment: Reports of none  Prior Outpatient Therapy Prior Outpatient Therapy: No Prior Therapy Dates: Reports of none Prior Therapy Facilty/Provider(s): Reports of none Reason for Treatment: Reports of none Does patient have an ACCT team?: No Does patient have Intensive In-House Services?  : No Does patient have [facility] services? : No Does patient have P4CC services?: No  ADL Screening (condition at time of admission) Patient's cognitive ability adequate to safely complete daily activities?: Yes Is the patient deaf or have difficulty hearing?: No Does the patient have difficulty seeing, even when wearing glasses/contacts?: No Does the patient have difficulty concentrating, remembering, or making decisions?: No Patient able to express need for assistance with ADLs?: Yes Does the patient have difficulty dressing or bathing?: No Independently performs ADLs?: Yes (appropriate for developmental age) Does the patient have difficulty walking or climbing stairs?: No Weakness of Legs: None Weakness of Arms/Hands: None  Home Assistive Devices/Equipment Home Assistive  Devices/Equipment: None  Therapy Consults (therapy consults require a physician order) PT Evaluation Needed: No OT Evalulation Needed: No SLP Evaluation Needed: No Abuse/Neglect Assessment (Assessment to be complete while patient is alone) Physical Abuse: Denies Verbal Abuse: Yes, past (Comment) Sexual Abuse: Denies Exploitation of patient/patient's resources: Denies Self-Neglect: Denies Values / Beliefs Cultural Requests During Hospitalization: None Spiritual Requests During Hospitalization: None Consults Spiritual Care Consult Needed: No Social Work Consult Needed: No Merchant navy officerAdvance Directives (For Healthcare) Does patient have an advance directive?: No Would patient like information on creating an advanced directive?: No - patient declined information    Additional Information 1:1 In Past 12 Months?: No CIRT Risk: No Elopement Risk: No Does patient have medical clearance?: No (Going to be admitted to medical floor)  Child/Adolescent Assessment Running Away Risk: Denies (Patient is an adult)  Disposition:     On Site Evaluation by:   Reviewed with Physician:    Lilyan Gilfordalvin J. Cleland Simkins MS, LCAS, LPC, NCC, CCSI Therapeutic Triage Specialist 04/04/2016 6:47 AM

## 2016-04-04 NOTE — ED Notes (Signed)
Patient stated that his girlfriend tested positive for genital herpes. He requested that we test him for herpes and other STDs while he is here. RN Gwynneth MunsonButch was informed.

## 2016-04-04 NOTE — Progress Notes (Signed)
Pt seen and examined. Tearful Sitter+ due to IVC for suicidal ideation Eating ok Psych consulted Seen by Nephrology Monitor I and O

## 2016-04-04 NOTE — ED Notes (Signed)
Pt also making comments that he took "50 Aleve PMs" a couple days ago and was describing how "crazy" he was acting and described running around "naked" at his Aunt's house. Pt denies SI/HI, but repeatedly keeps stating that he "needs someone to talk to" and how he wants to "straighten up my life".  Dr Dolores FrameSung made aware; 1:1 sitter Wende Neighbors(Sarah Blake, EDT) in place for pt safety until pt can be moved closer to Nurse's station where he can be observed more closely.

## 2016-04-04 NOTE — ED Triage Notes (Signed)
Pt presents to ED via [facility] d/t tachycardia, anxiety, and substance abuse problems. Pt reports calling EMS when walking from the store for drug abuse issues and anxiety. Pt reports taking (2) 10/325 Percocets in the last and a $20 bag of cocaine in the last 24 hrs. Pt is anxious and requesting "someone to talk to" for his problem.

## 2016-04-04 NOTE — H&P (Signed)
[facility] [place] [facility]   PATIENT NAME: Carl Parrish    MR#:  [id number]  DATE OF BIRTH:  06/02/1976  DATE OF ADMISSION:  04/04/2016  PRIMARY CARE PHYSICIAN: No PCP Per Patient   REQUESTING/REFERRING PHYSICIAN:   CHIEF COMPLAINT:   Chief Complaint  Patient presents with  . Tachycardia  . Addiction Problem  . Anxiety    HISTORY OF PRESENT ILLNESS: Carl Parrish  is a 40 y.o. male with a known history of Hypertension, GERD, substance abuse presented to the emergency room after he was brought by EMS. Patient called EMS as he was feeling anxious and he wanted to talk to someone about his problems. Patient took 50 tablets of Aleve couple of days ago to hurt himself and he had thoughts of killing himself. Patient feels depressed and also has feelings of worthlessness and hopelessness. He says he has been running naked at his aunt's house. And yesterday he drank beer and took oral Percocet and also continued cocaine prior to calling to the EMS. Patient wanted to be evaluated by behavioral health and hence came to the emergency room. He was worked up in the emergency room and was found to be in renal failure. Tylenol and salicylate levels were  Normal. Patient complained of flank pain but no complaints of any fever or chills. No history of any sick contacts at home. No complaints of chest pain or any shortness of breath. His WBC count was elevated during the workup but denies any fever or cough. Patient is involuntarily committed and behavioral health consult was initiated by emergency room physician.  PAST MEDICAL HISTORY:   Past Medical History:  Diagnosis Date  . GERD (gastroesophageal reflux disease)   . Hypertension     PAST SURGICAL HISTORY: Past Surgical History:  Procedure Laterality Date  . CHOLECYSTECTOMY      SOCIAL HISTORY:  Social History  Substance Use Topics  . Smoking status: Light Tobacco Smoker  . Smokeless tobacco: Never Used  .  Alcohol use Yes    FAMILY HISTORY:  Family History  Problem Relation Age of Onset  . Arthritis Mother     DRUG ALLERGIES: No Known Allergies  REVIEW OF SYSTEMS:   CONSTITUTIONAL: No fever, fatigue or weakness.  EYES: No blurred or double vision.  EARS, NOSE, AND THROAT: No tinnitus or ear pain.  RESPIRATORY: No cough, shortness of breath, wheezing or hemoptysis.  CARDIOVASCULAR: No chest pain, orthopnea, edema.  GASTROINTESTINAL: No nausea, vomiting, diarrhea Had flank pain. GENITOURINARY: No dysuria, hematuria.  ENDOCRINE: No polyuria, nocturia,  HEMATOLOGY: No anemia, easy bruising or bleeding SKIN: No rash or lesion. MUSCULOSKELETAL: No joint pain or arthritis.   NEUROLOGIC: No tingling, numbness, weakness.  PSYCHIATRY: depressed  MEDICATIONS AT HOME:  Prior to Admission medications   Not on File      PHYSICAL EXAMINATION:   VITAL SIGNS: Blood pressure 119/83, pulse 98, temperature 98.7 F (37.1 C), temperature source Oral, resp. rate 16, height 5\' 6"  (1.676 m), weight 72.6 kg (160 lb), SpO2 95 %.  GENERAL:  40 y.o.-year-old patient lying in the bed with no acute distress.  EYES: Pupils equal, round, reactive to light and accommodation. No scleral icterus. Extraocular muscles intact.  HEENT: Head atraumatic, normocephalic. Oropharynx and nasopharynx clear.  NECK:  Supple, no jugular venous distention. No thyroid enlargement, no tenderness.  LUNGS: Normal breath sounds bilaterally, no wheezing, rales,rhonchi or crepitation. No use of accessory muscles of respiration.  CARDIOVASCULAR: S1, S2 normal. No  murmurs, rubs, or gallops.  ABDOMEN: Soft, mild tenderness left costo vertebral angle area  nondistended. Bowel sounds present. No organomegaly or mass.  EXTREMITIES: No pedal edema, cyanosis, or clubbing.  NEUROLOGIC: Cranial nerves II through XII are intact. Muscle strength 5/5 in all extremities. Sensation intact. Gait normal.  PSYCHIATRIC: The patient is alert and  oriented x 3.  SKIN: No obvious rash, lesion, or ulcer.   LABORATORY PANEL:   CBC  Recent Labs Lab 04/04/16 0420  WBC 33.3*  HGB 16.3  HCT 48.0  PLT 334  MCV 82.3  MCH 28.0  MCHC 34.0  RDW 13.6   ------------------------------------------------------------------------------------------------------------------  Chemistries   Recent Labs Lab 04/04/16 0420  NA 136  K 3.1*  CL 100*  CO2 21*  GLUCOSE 172*  BUN 24*  CREATININE 4.85*  CALCIUM 9.5  AST 74*  ALT 66*  ALKPHOS 71  BILITOT 0.9   ------------------------------------------------------------------------------------------------------------------ estimated creatinine clearance is 18.6 mL/min (by C-G formula based on SCr of 4.85 mg/dL). ------------------------------------------------------------------------------------------------------------------ No results for input(s): TSH, T4TOTAL, T3FREE, THYROIDAB in the last 72 hours.  Invalid input(s): FREET3   Coagulation profile No results for input(s): INR, PROTIME in the last 168 hours. ------------------------------------------------------------------------------------------------------------------- No results for input(s): DDIMER in the last 72 hours. -------------------------------------------------------------------------------------------------------------------  Cardiac Enzymes No results for input(s): CKMB, TROPONINI, MYOGLOBIN in the last 168 hours.  Invalid input(s): CK ------------------------------------------------------------------------------------------------------------------ Invalid input(s): POCBNP  ---------------------------------------------------------------------------------------------------------------  Urinalysis    Component Value Date/Time   COLORURINE YELLOW 02/10/2010 2032   APPEARANCEUR CLEAR 02/10/2010 2032   LABSPEC 1.010 02/10/2010 2032   PHURINE 5.5 02/10/2010 2032   GLUCOSEU NEGATIVE 02/10/2010 2032   HGBUR LARGE  (A) 02/10/2010 2032   BILIRUBINUR NEGATIVE 02/10/2010 2032   KETONESUR NEGATIVE 02/10/2010 2032   PROTEINUR NEGATIVE 02/10/2010 2032   UROBILINOGEN 0.2 02/10/2010 2032   NITRITE NEGATIVE 02/10/2010 2032   LEUKOCYTESUR NEGATIVE 02/10/2010 2032     RADIOLOGY: Dg Chest Port 1 View  Result Date: 04/04/2016 CLINICAL DATA:  Leukocytosis.  Drug use in the last 24 hours. EXAM: PORTABLE CHEST 1 VIEW COMPARISON:  11/26/2015 FINDINGS: The heart size and mediastinal contours are within normal limits. Both lungs are clear. The visualized skeletal structures are unremarkable. IMPRESSION: No active disease. Electronically Signed   By: Burman NievesWilliam  Stevens M.D.   On: 04/04/2016 05:14    EKG: Orders placed or performed during the hospital encounter of 04/04/16  . ED EKG  . ED EKG  . EKG 12-Lead  . EKG 12-Lead    IMPRESSION AND PLAN: 40 year old male patient with history of GERD, hypertension, substance abuse presented to the emergency room with anxiety. Admitting diagnosis 1. Acute renal failure 2. Dehydration 3. Leukocytosis secondary to stress 4. Suicide attempt 5. Depression 6 hypokalemia Treatment plan Admit patient to medical floor One-to-one observation for patient safety Psychiatric consultation IV fluid hydration Follow-up renal function Renal ultrasound to assess for any obstruction Follow-up WBC count Replace potassium. Supportive care  All the records are reviewed and case discussed with ED provider. Management plans discussed with the patient, family and they are in agreement.  CODE STATUS:FULL Code Status History    This patient does not have a recorded code status. Please follow your organizational policy for patients in this situation.       TOTAL TIME TAKING CARE OF THIS PATIENT: 52 minutes.    Ihor AustinPavan Jettie Mannor M.D on 04/04/2016 at 6:11 AM  Between 7am to 6pm - Pager - [phone]  After 6pm go to [email or web address] - password [email or web address]  [facility]  [facility]   Office  [phone]  CC: Primary care physician; No PCP Per Patient

## 2016-04-04 NOTE — Consult Note (Signed)
[facility] CONSULT NOTE    Date: 04/04/2016                  Patient Name:  Carl Parrish  MRN: [id number]  DOB: 01/14/1976  Age / Sex: 40 y.o., male         PCP: No PCP Per Patient                 Service Requesting Consult: Dr. Enedina FinnerSona Patel                 Reason for Consult: Acute renal failure            History of Present Illness: Patient is a 40 y.o. male with a PMHx of GERD and hypertension, who was admitted to [facility] on 04/04/2016 for severe acute renal failure after ingestion of Aleve PM. He reports that he took approximately 50 pills of Aleve PM on Wednesday. In addition he's had some recent cocaine intake.  We are asked to now see him for acute renal failure. BUN is currently 24 with a creatinine of 4.85. Serum bicarbonate is slightly low at 21. CK was also checked and is normal at 106. Serum acetaminophen level was normal and serum salicylate level was less than 4. There was mild proteinuria noted as well. The patient is currently under involuntary commitment. He has an elevated WBC count.   Medications: Outpatient medications: No prescriptions prior to admission.    Current medications: Current Facility-Administered Medications  Medication Dose Route Frequency Provider Last Rate Last Dose  . 0.9 %  sodium chloride infusion   Intravenous Continuous Enedina FinnerSona Patel, MD 200 mL/hr at 04/04/16 1103    . acetaminophen (TYLENOL) tablet 650 mg  650 mg Oral Q6H PRN Ihor AustinPavan Pyreddy, MD       Or  . acetaminophen (TYLENOL) suppository 650 mg  650 mg Rectal Q6H PRN Ihor AustinPavan Pyreddy, MD      . heparin injection 5,000 Units  5,000 Units Subcutaneous Q8H Ihor AustinPavan Pyreddy, MD   5,000 Units at 04/04/16 0854  . ondansetron (ZOFRAN) tablet 4 mg  4 mg Oral Q6H PRN Ihor AustinPavan Pyreddy, MD       Or  . ondansetron (ZOFRAN) injection 4 mg  4 mg Intravenous Q6H PRN Pavan Pyreddy, MD      . senna-docusate (Senokot-S) tablet 1 tablet  1 tablet Oral QHS PRN Ihor AustinPavan Pyreddy, MD           Allergies: No Known Allergies    Past Medical History: Past Medical History:  Diagnosis Date  . GERD (gastroesophageal reflux disease)   . Hypertension      Past Surgical History: Past Surgical History:  Procedure Laterality Date  . CHOLECYSTECTOMY       Family History: Family History  Problem Relation Age of Onset  . Arthritis Mother      Social History: Social History   Social History  . Marital status: Single    Spouse name: N/A  . Number of children: N/A  . Years of education: N/A   Occupational History  . tree cutter    Social History Main Topics  . Smoking status: Light Tobacco Smoker  . Smokeless tobacco: Never Used  . Alcohol use Yes  . Drug use:     Types: Marijuana, Cocaine  . Sexual activity: Not on file   Other Topics Concern  . Not on file   Social History Narrative  . No narrative on file     Review  of Systems: As per HPI  Vital Signs: Blood pressure 125/73, pulse 84, temperature 97.7 F (36.5 C), temperature source Oral, resp. rate 16, height 5\' 6"  (1.676 m), weight 71.9 kg (158 lb 8 oz), SpO2 100 %.  Weight trends: Filed Weights   04/04/16 0414 04/04/16 0754  Weight: 72.6 kg (160 lb) 71.9 kg (158 lb 8 oz)    Physical Exam: General: NAD, sitting up in chair  Head: Normocephalic, atraumatic.  Eyes: Anicteric, EOMI  Nose: Mucous membranes moist, not inflammed, nonerythematous.  Throat: Oropharynx nonerythematous, no exudate appreciated.   Neck: Supple, trachea midline.  Lungs:  Normal respiratory effort. Clear to auscultation BL without crackles or wheezes.  Heart: RRR. S1 and S2 normal without gallop, murmur, or rubs.  Abdomen:  BS normoactive. Soft, Nondistended, non-tender.  No masses or organomegaly.  Extremities: No pretibial edema.  Neurologic: A&O X3, Motor strength is 5/5 in the all 4 extremities  Skin: No visible rashes, scars.    Lab results: Basic Metabolic Panel:  Recent Labs Lab 04/04/16 0420  NA  136  K 3.1*  CL 100*  CO2 21*  GLUCOSE 172*  BUN 24*  CREATININE 4.85*  CALCIUM 9.5    Liver Function Tests:  Recent Labs Lab 04/04/16 0420  AST 74*  ALT 66*  ALKPHOS 71  BILITOT 0.9  PROT 8.7*  ALBUMIN 4.9   No results for input(s): LIPASE, AMYLASE in the last 168 hours. No results for input(s): AMMONIA in the last 168 hours.  CBC:  Recent Labs Lab 04/04/16 0420  WBC 33.3*  HGB 16.3  HCT 48.0  MCV 82.3  PLT 334    Cardiac Enzymes:  Recent Labs Lab 04/04/16 0420  CKTOTAL 106    BNP: Invalid input(s): POCBNP  CBG: No results for input(s): GLUCAP in the last 168 hours.  Microbiology: Results for orders placed or performed during the hospital encounter of 04/04/16  Chlamydia/NGC rt PCR     Status: None   Collection Time: 04/04/16  5:50 AM  Result Value Ref Range Status   Specimen source GC/Chlam URINE, RANDOM  Final   Chlamydia Tr NOT DETECTED NOT DETECTED Final   N gonorrhoeae NOT DETECTED NOT DETECTED Final    Comment: (NOTE) 100  This methodology has not been evaluated in pregnant women or in 200  patients with a history of hysterectomy. 300 400  This methodology will not be performed on patients less than 914  years of age.     Coagulation Studies: No results for input(s): LABPROT, INR in the last 72 hours.  Urinalysis:  Recent Labs  04/04/16 0550  COLORURINE AMBER*  LABSPEC 1.025  PHURINE 5.0  GLUCOSEU NEGATIVE  HGBUR 2+*  BILIRUBINUR NEGATIVE  KETONESUR NEGATIVE  PROTEINUR 100*  NITRITE NEGATIVE  LEUKOCYTESUR NEGATIVE      Imaging: [place] Renal  Result Date: 04/04/2016 CLINICAL DATA:  Acute renal failure EXAM: RENAL / URINARY TRACT ULTRASOUND COMPLETE COMPARISON:  CT scan 01/27/2011 FINDINGS: Right Kidney: Length: 10.5 cm. Echogenicity within normal limits. No mass or hydronephrosis visualized. Left Kidney: Length: 10.7 cm. Echogenicity within normal limits. No mass or hydronephrosis visualized. Bladder: Appears normal for  degree of bladder distention. Bilateral ureteral jets demonstrated on color Doppler assessment. IMPRESSION: Normal urinary tract ultrasound. Electronically Signed   By: Kennith CenterEric  Mansell M.D.   On: 04/04/2016 09:33   Dg Chest Port 1 View  Result Date: 04/04/2016 CLINICAL DATA:  Leukocytosis.  Drug use in the last 24 hours. EXAM: PORTABLE CHEST 1 VIEW  COMPARISON:  11/26/2015 FINDINGS: The heart size and mediastinal contours are within normal limits. Both lungs are clear. The visualized skeletal structures are unremarkable. IMPRESSION: No active disease. Electronically Signed   By: Burman Nieves M.D.   On: 04/04/2016 05:14      Assessment & Plan: Pt is a 40 y.o. male with a PMHX of with a PMHx of GERD and hypertension, who was admitted to [facility] on 04/04/2016 for severe acute renal failure after ingestion of Aleve PM.   1.  Acute renal failure secondary to ingestion of 50 aleve pills. 2.  Leukocytosis, > reactive. 3.  Proteinuria, likely tubular in origin. 4.  Hypokalemia.  Plan:  The patient presented after ingestion of at least 50 Aleve pills. Thus far he does not appear to have developed severe acidosis is most recent serum bicarbonate was found to be 21. We will continue to check renal function every 6 hours for now. No urgent indication to proceed with bicarbonate drip at the moment. Continue IV fluid hydration with 0.9 normal saline. Avoid any further nephrotoxin exposure. I did advise the patient that there is some potential for renal replacement therapy if his renal function were to continue to deteriorate. He verbalized understanding of this. There is not a role for hemodialysis for naproxen toxicity as it is highly protein bound. Therefore we recommend continued supportive care at the moment. Thanks for consultation.
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
## 2016-04-04 NOTE — ED Notes (Signed)
Patient requests that he be tested for HIV, based on the fact that he has used heroin "a lot" in the past, and has never been tested for HIV. RN Gwynneth MunsonButch was informed.

## 2016-04-04 NOTE — ED Notes (Signed)
Patient states that he has experienced pain and burning upon urination X 2 days. He also complains of flank pain, X 2 days, becoming increasingly worse.

## 2016-04-05 ENCOUNTER — Inpatient Hospital Stay
Admission: AD | Admit: 2016-04-05 | Discharge: 2016-04-07 | DRG: 885 | Disposition: A | Discharge status: Home / Self Care | Payer: No Typology Code available for payment source | Source: Ambulatory Visit | Attending: Psychiatry | Admitting: Psychiatry

## 2016-04-05 DIAGNOSIS — F322 Major depressive disorder, single episode, severe without psychotic features: Secondary | ICD-10-CM | POA: Diagnosis present

## 2016-04-05 DIAGNOSIS — N179 Acute kidney failure, unspecified: Secondary | ICD-10-CM | POA: Diagnosis present

## 2016-04-05 DIAGNOSIS — Z9049 Acquired absence of other specified parts of digestive tract: Secondary | ICD-10-CM

## 2016-04-05 DIAGNOSIS — Z8261 Family history of arthritis: Secondary | ICD-10-CM

## 2016-04-05 DIAGNOSIS — F1994 Other psychoactive substance use, unspecified with psychoactive substance-induced mood disorder: Secondary | ICD-10-CM | POA: Diagnosis present

## 2016-04-05 DIAGNOSIS — Z915 Personal history of self-harm: Secondary | ICD-10-CM | POA: Diagnosis not present

## 2016-04-05 DIAGNOSIS — F1420 Cocaine dependence, uncomplicated: Secondary | ICD-10-CM | POA: Diagnosis present

## 2016-04-05 DIAGNOSIS — G4710 Hypersomnia, unspecified: Secondary | ICD-10-CM | POA: Diagnosis present

## 2016-04-05 DIAGNOSIS — F17200 Nicotine dependence, unspecified, uncomplicated: Secondary | ICD-10-CM | POA: Diagnosis present

## 2016-04-05 DIAGNOSIS — F332 Major depressive disorder, recurrent severe without psychotic features: Secondary | ICD-10-CM | POA: Diagnosis present

## 2016-04-05 DIAGNOSIS — G4700 Insomnia, unspecified: Secondary | ICD-10-CM | POA: Diagnosis present

## 2016-04-05 DIAGNOSIS — R45851 Suicidal ideations: Secondary | ICD-10-CM | POA: Diagnosis present

## 2016-04-05 DIAGNOSIS — I10 Essential (primary) hypertension: Secondary | ICD-10-CM | POA: Diagnosis present

## 2016-04-05 DIAGNOSIS — K219 Gastro-esophageal reflux disease without esophagitis: Secondary | ICD-10-CM | POA: Diagnosis present

## 2016-04-05 DIAGNOSIS — T1491XA Suicide attempt, initial encounter: Secondary | ICD-10-CM | POA: Diagnosis present

## 2016-04-05 LAB — CBC
HEMATOCRIT: 37 % — AB (ref 40.0–52.0)
Hemoglobin: 12.6 g/dL — ABNORMAL LOW (ref 13.0–18.0)
MCH: 28.6 pg (ref 26.0–34.0)
MCHC: 34.1 g/dL (ref 32.0–36.0)
MCV: 83.8 fL (ref 80.0–100.0)
Platelets: 205 10*3/uL (ref 150–440)
RBC: 4.42 MIL/uL (ref 4.40–5.90)
RDW: 13.8 % (ref 11.5–14.5)
WBC: 11 10*3/uL — ABNORMAL HIGH (ref 3.8–10.6)

## 2016-04-05 LAB — BASIC METABOLIC PANEL
Anion gap: 3 — ABNORMAL LOW (ref 5–15)
BUN: 16 mg/dL (ref 6–20)
CHLORIDE: 113 mmol/L — AB (ref 101–111)
CO2: 21 mmol/L — ABNORMAL LOW (ref 22–32)
Calcium: 8.1 mg/dL — ABNORMAL LOW (ref 8.9–10.3)
Creatinine, Ser: 0.75 mg/dL (ref 0.61–1.24)
GFR calc Af Amer: >60 mL/min (ref >60)
GLUCOSE: 94 mg/dL (ref 65–99)
POTASSIUM: 4.6 mmol/L (ref 3.5–5.1)
Sodium: 137 mmol/L (ref 135–145)

## 2016-04-05 MED ORDER — SODIUM CHLORIDE 0.9% FLUSH: 3.0000 mL | INTRAVENOUS | Status: DC | PRN

## 2016-04-05 MED ORDER — HYDROXYZINE HCL 25 MG PO TABS
25.0000 mg | ORAL_TABLET | Freq: Three times a day (TID) | ORAL | Status: DC | PRN
  Administered 2016-04-06 – 2016-04-07 (×3): 25 mg via ORAL
  Filled 2016-04-05 (×3): qty 1

## 2016-04-05 MED ORDER — TRAZODONE HCL 50 MG PO TABS
50.0000 mg | ORAL_TABLET | Freq: Every evening | ORAL | Status: DC | PRN
  Filled 2016-04-05: qty 1

## 2016-04-05 MED ORDER — MAGNESIUM HYDROXIDE 400 MG/5ML PO SUSP: 30.0000 mL | Freq: Every day | ORAL | Status: DC | PRN

## 2016-04-05 MED ORDER — ALUM & MAG HYDROXIDE-SIMETH 200-200-20 MG/5ML PO SUSP: 30.0000 mL | ORAL | Status: DC | PRN

## 2016-04-05 MED ORDER — SODIUM CHLORIDE 0.9% FLUSH
3.0000 mL | Freq: Two times a day (BID) | INTRAVENOUS | Status: DC
  Administered 2016-04-05: 10:00:00 3 mL via INTRAVENOUS

## 2016-04-05 NOTE — Tx Team (Signed)
Initial Interdisciplinary Treatment Plan   PATIENT STRESSORS: Health problems Medication change or noncompliance Substance abuse   PATIENT STRENGTHS: Ability for insight Average or above average intelligence Capable of independent living Communication skills Financial means Supportive family/friends   PROBLEM LIST: Problem List/Patient Goals Date to be addressed Date deferred Reason deferred Estimated date of resolution  Depression 04/05/2016     Suicidal thinking 04/05/2016                                                DISCHARGE CRITERIA:  Improved stabilization in mood, thinking, and/or behavior Safe-care adequate arrangements made Withdrawal symptoms are absent or subacute and managed without 24-hour nursing intervention  PRELIMINARY DISCHARGE PLAN: Attend aftercare/continuing care group Return to previous living arrangement  PATIENT/FAMIILY INVOLVEMENT: This treatment plan has been presented to and reviewed with the patient, Carl Parrish, and/or family member, .  The patient and family have been given the opportunity to ask questions and make suggestions.  Carl Parrish 04/05/2016, 6:46 PM

## 2016-04-05 NOTE — Discharge Summary (Signed)
[facility] [place] [facility]   PATIENT NAME: Carl BurowsMark Parrish    MR#:  [id number]  DATE OF BIRTH:  09/12/1975  DATE OF ADMISSION:  04/04/2016 ADMITTING PHYSICIAN: Ihor AustinPavan Pyreddy, MD  DATE OF DISCHARGE: 04/05/16  PRIMARY CARE PHYSICIAN: No PCP Per Patient    ADMISSION DIAGNOSIS:  Anxiety [F41.9] Leukocytosis [D72.829] Renal failure [N19] Tachycardia [R00.0] Acute renal failure (ARF) (HCC) [N17.9]  DISCHARGE DIAGNOSIS:  Acute Renal failure from Overdose with NSAIDS-resolved Major Depression Cocaine abuse   SECONDARY DIAGNOSIS:   Past Medical History:  Diagnosis Date  . GERD (gastroesophageal reflux disease)   . Hypertension     HOSPITAL COURSE:   40 year old male patient with history of GERD, hypertension,substance abuse presented to the emergency room with anxiety.  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   If you experience worsening of your admission symptoms, develop shortness of breath, life threatening emergency, suicidal or homicidal thoughts you must seek medical attention immediately by calling 911 or calling your MD immediately  if symptoms less severe.  You Must read complete instructions/literature along with all the possible adverse reactions/side effects for all the Medicines you take and that have been prescribed to  you. Take any new Medicines after you have completely understood and accept all the possible adverse reactions/side effects.   Please note  You were cared for by a hospitalist during your hospital stay. If you have any questions about your discharge medications or the care you received while you were in the hospital after you are discharged, you can call the unit and asked to speak with the hospitalist on call if the hospitalist that took care of you is not available. Once you are discharged, your primary care physician will handle any further medical issues. Please note that NO REFILLS for any discharge medications will be authorized once you are discharged, as it is imperative that you return to your primary care physician (or establish a relationship with a primary care physician if you do not have one) for your aftercare needs so that they can reassess your need for medications and monitor your lab values.  I/O:   Intake/Output Summary (Last 24 hours) at 04/05/16 1515 Last data filed at 04/05/16 1248  Gross per 24 hour  Intake             3277 ml  Output             3050 ml  Net              227 ml    DATA REVIEW:   CBC   Recent Labs Lab 04/05/16 0737  WBC 11.0*  HGB 12.6*  HCT 37.0*  PLT 205    Chemistries   Recent Labs Lab 04/04/16 0420 04/05/16 0737  NA 136 137  K 3.1* 4.6  CL 100* 113*  CO2 21* 21*  GLUCOSE 172* 94  BUN 24*  16  CREATININE 4.85* 0.75  CALCIUM 9.5 8.1*  AST 74*  --   ALT 66*  --   ALKPHOS 71  --   BILITOT 0.9  --     Microbiology Results   Recent Results (from the past 240 hour(s))  Chlamydia/NGC rt PCR     Status: None   Collection Time: 04/04/16  5:50 AM  Result Value Ref Range Status   Specimen source GC/Chlam URINE, RANDOM  Final   Chlamydia Tr NOT DETECTED NOT DETECTED Final   N gonorrhoeae NOT DETECTED NOT DETECTED Final    Comment: (NOTE) 100  This methodology has not been evaluated in pregnant women or in 200  patients with a  history of hysterectomy. 300 400  This methodology will not be performed on patients less than 7714  years of age.     RADIOLOGY:  [place] Renal  Result Date: 04/04/2016 CLINICAL DATA:  Acute renal failure EXAM: RENAL / URINARY TRACT ULTRASOUND COMPLETE COMPARISON:  CT scan 01/27/2011 FINDINGS: Right Kidney: Length: 10.5 cm. Echogenicity within normal limits. No mass or hydronephrosis visualized. Left Kidney: Length: 10.7 cm. Echogenicity within normal limits. No mass or hydronephrosis visualized. Bladder: Appears normal for degree of bladder distention. Bilateral ureteral jets demonstrated on color Doppler assessment. IMPRESSION: Normal urinary tract ultrasound. Electronically Signed   By: Kennith CenterEric  Mansell M.D.   On: 04/04/2016 09:33   Dg Chest Port 1 View  Result Date: 04/04/2016 CLINICAL DATA:  Leukocytosis.  Drug use in the last 24 hours. EXAM: PORTABLE CHEST 1 VIEW COMPARISON:  11/26/2015 FINDINGS: The heart size and mediastinal contours are within normal limits. Both lungs are clear. The visualized skeletal structures are unremarkable. IMPRESSION: No active disease. Electronically Signed   By: Burman NievesWilliam  Stevens M.D.   On: 04/04/2016 05:14     Management plans discussed with the patient, family and they are in agreement.  CODE STATUS:     Code Status Orders        Start     Ordered   04/04/16 0757  Full code  Continuous     04/04/16 0756    Code Status History    Date Active Date Inactive Code Status Order ID Comments User Context   This patient has a current code status but no historical code status.      TOTAL TIME TAKING CARE OF THIS PATIENT: 40 minutes.    Cheray Pardi M.D on 04/05/2016 at 3:15 PM  Between 7am to 6pm - Pager - [phone] After 6pm go to [email or web address] - password [email or web address] [facility]  [facility]  Office  [phone]  CC: Primary care physician; No PCP Per Patient

## 2016-04-05 NOTE — Discharge Instructions (Signed)
(  Home) Depression: Unilateral  Suicide Prevention Hotline 1-800-SUICIDE or (800) 161-WRUE(4540273-TALK(8255)  [facility] at ([phone]([phone]; [phone]) [phone](800) [phone]  More Resources  Suicide Awareness Voices of Education       9853286619(952) (812)[phone]        www.save.org  The [employer] on Mental Illness(NAMI)       (800) 950-NAMI        www.nami.org  American Association of Suicidology       [phone](202) [phone]        www.suicidology.org

## 2016-04-05 NOTE — Plan of Care (Signed)
Problem: Pain Managment: Goal: General experience of comfort will improve Outcome: Progressing patint states pain is controlled at this time [employer] RN

## 2016-04-05 NOTE — Consult Note (Signed)
[place] Psychiatry Consult   Reason for Consult: Consult for 40 year old man who is in the hospital after a suicide attempt by ingestion of over-the-counter pain medicine Referring Physician:  Dr. Posey Pronto Patient Identification: Carl Parrish admitted after suicidal attempt with overdose MRN:  [id number] Principal Diagnosis: Severe recurrent major depression without psychotic features [facility]) Diagnosis:   Patient Active Problem List   Diagnosis Date Noted  . Severe recurrent major depression without psychotic features ([place]) [F33.2] 04/04/2016    Priority: High  . Suicide attempt ([place]) [T14.91] 04/04/2016    Priority: High  . Acute renal failure (ARF) ([place]) [N17.9] 04/04/2016  . Cocaine abuse [F14.10] 04/04/2016  . Substance induced mood disorder [facility] [F19.94] 04/04/2016    Total Time spent with patient: 45 minutes  Subjective:   Carl Parrish is a 40 y.o. male patient admitted with  " I don't care whether I am alive or dead"  HPI:  Patient is a 40 year old Caucasian male who was seen by Dr. Weber Cooks  yesterday ,  reevaluated today. Patient initially presented after an overdose of 50 Aleve p.m. tablets on Wednesday. Patient had acute renal failure due to Aleve but now he is medically cleared . Reports that he's been depressed for about 2 or 3 weeks. His mood has been feeling bad and he feels down on himself all the time. Feels like he has never amounted to anything or been able to achieve anything in life and hopelessness. Reports poor sleep and appetite. This culminated in a relapse into intravenous cocaine use in the middle of this week. He says there was only one day that he relapsed into drug use and that it was only cocaine.  he reports that major stresses in his life are feeling like he can't ever get ahead , can't earn  money, and financial stressors.  Denies any psychotic symptoms. Denies any manic symptoms  Past Psychiatric History:  never seen a psychiatrist or therapist  before. Denies history of psychiatric hospitalization. Never been prescribed any psychiatric medicine. He says that he has had thoughts about suicide in the past but has never done anything like this to hurt himself previously.  Social history: He states that he lives with his aunt . 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 Doesn't feel like he can never earn any money or make any progress in his life. He has a girlfriend. Has extended family in the area.   Past Medical History:  Past Medical History:  Diagnosis Date  . GERD (gastroesophageal reflux disease)   . Hypertension     Past Surgical History:  Procedure Laterality Date  . CHOLECYSTECTOMY     Family History:  Family History  Problem Relation Age of Onset  . Arthritis Mother    Family Psychiatric  History: There are other people in the family with substance abuse issues. Social History:  History  Alcohol Use  . Yes     History  Drug Use  . Types: Marijuana, Cocaine    Social History   Social History  . Marital status: Single    Spouse name: N/A  . Number of children: N/A  . Years of education: N/A   Occupational History  . tree cutter    Social History Main Topics  . Smoking status: Light Tobacco Smoker  . Smokeless tobacco: Never Used  . Alcohol use Yes  . Drug use:     Types: Marijuana, Cocaine  . Sexual activity: Not Asked  Other Topics Concern  . None   Social History Narrative  . None   Additional Social History:    Allergies:  No Known Allergies  Labs:  Results for orders placed or performed during the hospital encounter of 04/04/16 (from the past 48 hour(s))  Comprehensive metabolic panel     Status: Abnormal   Collection Time: 04/04/16  4:20 AM  Result Value Ref Range   Sodium 136 135 - 145 mmol/L   Potassium 3.1 (L) 3.5 - 5.1 mmol/L   Chloride 100 (L) 101 - 111 mmol/L   CO2 21 (L) 22 - 32 mmol/L   Glucose, Bld 172 (H) 65 - 99 mg/dL   BUN 24 (H) 6 - 20 mg/dL    Creatinine, Ser 4.85 (H) 0.61 - 1.24 mg/dL   Calcium 9.5 8.9 - 10.3 mg/dL   Total Protein 8.7 (H) 6.5 - 8.1 g/dL   Albumin 4.9 3.5 - 5.0 g/dL   AST 74 (H) 15 - 41 U/L   ALT 66 (H) 17 - 63 U/L   Alkaline Phosphatase 71 38 - 126 U/L   Total Bilirubin 0.9 0.3 - 1.2 mg/dL   GFR calc non Af Amer 14 (L) >60 mL/min   GFR calc Af Amer 16 (L) >60 mL/min    Comment: (NOTE) The eGFR has been calculated using the CKD EPI equation. This calculation has not been validated in all clinical situations. eGFR's persistently <60 mL/min signify possible Chronic Kidney Disease.    Anion gap 15 5 - 15  Ethanol     Status: None   Collection Time: 04/04/16  4:20 AM  Result Value Ref Range   Alcohol, Ethyl (B) <5 <5 mg/dL    Comment:        LOWEST DETECTABLE LIMIT FOR SERUM ALCOHOL IS 5 mg/dL FOR MEDICAL PURPOSES ONLY   cbc     Status: Abnormal   Collection Time: 04/04/16  4:20 AM  Result Value Ref Range   WBC 33.3 (H) 3.8 - 10.6 K/uL   RBC 5.83 4.40 - 5.90 MIL/uL   Hemoglobin 16.3 13.0 - 18.0 g/dL   HCT 48.0 40.0 - 52.0 %   MCV 82.3 80.0 - 100.0 fL   MCH 28.0 26.0 - 34.0 pg   MCHC 34.0 32.0 - 36.0 g/dL   RDW 13.6 11.5 - 14.5 %   Platelets 334 150 - 440 K/uL  Acetaminophen level     Status: Abnormal   Collection Time: 04/04/16  4:20 AM  Result Value Ref Range   Acetaminophen (Tylenol), Serum <10 (L) 10 - 30 ug/mL    Comment:        THERAPEUTIC CONCENTRATIONS VARY SIGNIFICANTLY. A RANGE OF 10-30 ug/mL MAY BE AN EFFECTIVE CONCENTRATION FOR MANY PATIENTS. HOWEVER, SOME ARE BEST TREATED AT CONCENTRATIONS OUTSIDE THIS RANGE. ACETAMINOPHEN CONCENTRATIONS >150 ug/mL AT 4 HOURS AFTER INGESTION AND >50 ug/mL AT 12 HOURS AFTER INGESTION ARE OFTEN ASSOCIATED WITH TOXIC REACTIONS.   Salicylate level     Status: None   Collection Time: 04/04/16  4:20 AM  Result Value Ref Range   Salicylate Lvl <5.0 2.8 - 30.0 mg/dL  CK     Status: None   Collection Time: 04/04/16  4:20 AM  Result Value Ref  Range   Total CK 106 49 - 397 U/L  Urinalysis complete, with microscopic ([facility] only)     Status: Abnormal   Collection Time: 04/04/16  5:50 AM  Result Value Ref Range   Color, Urine AMBER (A) YELLOW  APPearance CLOUDY (A) CLEAR   Glucose, UA NEGATIVE NEGATIVE mg/dL   Bilirubin Urine NEGATIVE NEGATIVE   Ketones, ur NEGATIVE NEGATIVE mg/dL   Specific Gravity, Urine 1.025 1.005 - 1.030   Hgb urine dipstick 2+ (A) NEGATIVE   pH 5.0 5.0 - 8.0   Protein, ur 100 (A) NEGATIVE mg/dL   Nitrite NEGATIVE NEGATIVE   Leukocytes, UA NEGATIVE NEGATIVE   RBC / HPF 0-5 0 - 5 RBC/hpf   WBC, UA 6-30 0 - 5 WBC/hpf   Bacteria, UA RARE (A) NONE SEEN   Squamous Epithelial / LPF 0-5 (A) NONE SEEN  Urine Drug Screen, Qualitative ([facility] only)     Status: Abnormal   Collection Time: 04/04/16  5:50 AM  Result Value Ref Range   Tricyclic, Ur Screen NONE DETECTED NONE DETECTED   Amphetamines, Ur Screen NONE DETECTED NONE DETECTED   MDMA (Ecstasy)Ur Screen NONE DETECTED NONE DETECTED   Cocaine Metabolite,Ur [place] POSITIVE (A) NONE DETECTED   Opiate, Ur Screen POSITIVE (A) NONE DETECTED   Phencyclidine (PCP) Ur S NONE DETECTED NONE DETECTED   Cannabinoid 50 Ng, Ur [place] NONE DETECTED NONE DETECTED   Barbiturates, Ur Screen NONE DETECTED NONE DETECTED   Benzodiazepine, Ur Scrn NONE DETECTED NONE DETECTED   Methadone Scn, Ur NONE DETECTED NONE DETECTED    Comment: (NOTE) 161  Tricyclics, urine               Cutoff 1000 ng/mL 200  Amphetamines, urine             Cutoff 1000 ng/mL 300  MDMA (Ecstasy), urine           Cutoff 500 ng/mL 400  Cocaine Metabolite, urine       Cutoff 300 ng/mL 500  Opiate, urine                   Cutoff 300 ng/mL 600  Phencyclidine (PCP), urine      Cutoff 25 ng/mL 700  Cannabinoid, urine              Cutoff 50 ng/mL 800  Barbiturates, urine             Cutoff 200 ng/mL 900  Benzodiazepine, urine           Cutoff 200 ng/mL 1000 Methadone, urine                Cutoff 300 ng/mL 1100 1200  The urine drug screen provides only a preliminary, unconfirmed 1300 analytical test result and should not be used for non-medical 1400 purposes. Clinical consideration and professional judgment should 1500 be applied to any positive drug screen result due to possible 1600 interfering substances. A more specific alternate chemical method 1700 must be used in order to obtain a confirmed analytical result.  1800 Gas chromato graphy / mass spectrometry (GC/MS) is the preferred 1900 confirmatory method.   [place] rt PCR     Status: None   Collection Time: 04/04/16  5:50 AM  Result Value Ref Range   Specimen source GC/Chlam URINE, RANDOM    Chlamydia Tr NOT DETECTED NOT DETECTED   N gonorrhoeae NOT DETECTED NOT DETECTED    Comment: (NOTE) 100  This methodology has not been evaluated in pregnant women or in 200  patients with a history of hysterectomy. 300 400  This methodology will not be performed on patients less than 26  years of age.   Basic metabolic panel     Status: Abnormal   Collection  Time: 04/05/16  7:37 AM  Result Value Ref Range   Sodium 137 135 - 145 mmol/L    Comment: LYTES REPEATED QSD   Potassium 4.6 3.5 - 5.1 mmol/L   Chloride 113 (H) 101 - 111 mmol/L   CO2 21 (L) 22 - 32 mmol/L   Glucose, Bld 94 65 - 99 mg/dL   BUN 16 6 - 20 mg/dL   Creatinine, Ser 0.75 0.61 - 1.24 mg/dL   Calcium 8.1 (L) 8.9 - 10.3 mg/dL   GFR calc non Af Amer >60 >60 mL/min   GFR calc Af Amer >60 >60 mL/min    Comment: (NOTE) The eGFR has been calculated using the CKD EPI equation. This calculation has not been validated in all clinical situations. eGFR's persistently <60 mL/min signify possible Chronic Kidney Disease.    Anion gap 3 (L) 5 - 15  CBC     Status: Abnormal   Collection Time: 04/05/16  7:37 AM  Result Value Ref Range   WBC 11.0 (H) 3.8 - 10.6 K/uL   RBC 4.42 4.40 - 5.90 MIL/uL   Hemoglobin 12.6 (L) 13.0 - 18.0 g/dL   HCT 37.0 (L) 40.0 - 52.0 %   MCV 83.8 80.0 -  100.0 fL   MCH 28.6 26.0 - 34.0 pg   MCHC 34.1 32.0 - 36.0 g/dL   RDW 13.8 11.5 - 14.5 %   Platelets 205 150 - 440 K/uL    Current Facility-Administered Medications  Medication Dose Route Frequency Provider Last Rate Last Dose  . acetaminophen (TYLENOL) tablet 650 mg  650 mg Oral Q6H PRN Saundra Shelling, MD   650 mg at 04/05/16 1518   Or  . acetaminophen (TYLENOL) suppository 650 mg  650 mg Rectal Q6H PRN Saundra Shelling, MD      . heparin injection 5,000 Units  5,000 Units Subcutaneous Q8H Saundra Shelling, MD   5,000 Units at 04/05/16 0955  . ondansetron (ZOFRAN) tablet 4 mg  4 mg Oral Q6H PRN Pavan Pyreddy, MD      . senna-docusate (Senokot-S) tablet 1 tablet  1 tablet Oral QHS PRN Saundra Shelling, MD      . traMADol Veatrice Bourbon) tablet 50 mg  50 mg Oral Q12H PRN Fritzi Mandes, MD   50 mg at 04/05/16 1005    Musculoskeletal: Strength & Muscle Tone: within normal limits Gait & Station: normal  Psychiatric Specialty Exam: Physical Exam  ROS  Blood pressure (!) 142/86, pulse 60, temperature 97.8 F (36.6 C), temperature source Oral, resp. rate 18, height 5' 6" (1.676 m), weight 71.9 kg (158 lb 8 oz), SpO2 100 %.Body mass index is 25.58 kg/m.  General Appearance: Well Groomed  Eye Contact:  Poor  Speech:  Slow  Volume:  Decreased  Mood:  Depressed  Affect:  Tearful  Thought Process:  Coherent  Orientation:  Full (Time, Place, and Person)  Thought Content:  Depressive thoughts and hopelessness helplessness  Suicidal Thoughts:  No, But he states "I didn't care whether I am alive or date"   Homicidal Thoughts:  No  Memory:  Grossly intact  Judgement:  Impaired  Insight:  Lacking  Psychomotor Activity:  Normal and Decreased  Concentration:  fair  Recall:  intact  Fund of Knowledge:  Good  Language:  Good  Akathisia:  Negative  Handed:  Right  AIMS (if indicated):     Assets:    ADL's:  Intact  Cognition:  WNL  Sleep:        Treatment Plan  Summary:  This is a 40 year old man who  made a serious suicide attempt. he is continuing to express a depressed mood and hopelessness,  death wishes and indirect suicidal ideation, There is no indication of psychosis. Patient needs inpatient psych treatment for safety and stabilization Disposition: Recommend psychiatric Inpatient admission when medically cleared.  Lenward Chancellor, MD 04/05/2016 3:41 PM

## 2016-04-05 NOTE — Progress Notes (Signed)
[facility]  ROUNDING NOTE   Subjective:  Patient still with depressed mood. Surprisingly renal function has returned completely to normal now. Good urine output noted.   Objective:  Vital signs in last 24 hours:  Temp:  [97.8 F (36.6 C)-98.2 F (36.8 C)] 97.8 F (36.6 C) (08/19 0449) Pulse Rate:  [60-86] 60 (08/19 0449) Resp:  [16-18] 18 (08/19 0449) BP: (115-142)/(67-86) 142/86 (08/19 0449) SpO2:  [97 %-100 %] 100 % (08/19 0449)  Weight change: -0.68 kg (-1 lb 8 oz) Filed Weights   04/04/16 0414 04/04/16 0754  Weight: 72.6 kg (160 lb) 71.9 kg (158 lb 8 oz)    Intake/Output: I/O last 3 completed shifts: In: 3210 [P.O.:480; I.V.:2730] Out: 1350 [Urine:1350]   Intake/Output this shift:  Total I/O In: 1437 [Other:1437] Out: 700 [Urine:700]  Physical Exam: General: No acute distress  Head: Normocephalic, atraumatic. Moist oral mucosal membranes  Eyes: Anicteric  Neck: Supple, trachea midline  Lungs:  Clear to auscultation, normal effort  Heart: S1S2 no rubs  Abdomen:  Soft, nontender,   Extremities:  peripheral edema.  Neurologic: Nonfocal, moving all four extremities, depressed mood   Skin: No lesions       Basic Metabolic Panel:  Recent Labs Lab 04/04/16 0420 04/05/16 0737  NA 136 137  K 3.1* 4.6  CL 100* 113*  CO2 21* 21*  GLUCOSE 172* 94  BUN 24* 16  CREATININE 4.85* 0.75  CALCIUM 9.5 8.1*    Liver Function Tests:  Recent Labs Lab 04/04/16 0420  AST 74*  ALT 66*  ALKPHOS 71  BILITOT 0.9  PROT 8.7*  ALBUMIN 4.9   No results for input(s): LIPASE, AMYLASE in the last 168 hours. No results for input(s): AMMONIA in the last 168 hours.  CBC:  Recent Labs Lab 04/04/16 0420 04/05/16 0737  WBC 33.3* 11.0*  HGB 16.3 12.6*  HCT 48.0 37.0*  MCV 82.3 83.8  PLT 334 205    Cardiac Enzymes:  Recent Labs Lab 04/04/16 0420  CKTOTAL 106    BNP: Invalid input(s): POCBNP  CBG: No results for input(s): GLUCAP in the  last 168 hours.  Microbiology: Results for orders placed or performed during the hospital encounter of 04/04/16  Chlamydia/NGC rt PCR     Status: None   Collection Time: 04/04/16  5:50 AM  Result Value Ref Range Status   Specimen source GC/Chlam URINE, RANDOM  Final   Chlamydia Tr NOT DETECTED NOT DETECTED Final   N gonorrhoeae NOT DETECTED NOT DETECTED Final    Comment: (NOTE) 100  This methodology has not been evaluated in pregnant women or in 200  patients with a history of hysterectomy. 300 400  This methodology will not be performed on patients less than 6714  years of age.     Coagulation Studies: No results for input(s): LABPROT, INR in the last 72 hours.  Urinalysis:  Recent Labs  04/04/16 0550  COLORURINE AMBER*  LABSPEC 1.025  PHURINE 5.0  GLUCOSEU NEGATIVE  HGBUR 2+*  BILIRUBINUR NEGATIVE  KETONESUR NEGATIVE  PROTEINUR 100*  NITRITE NEGATIVE  LEUKOCYTESUR NEGATIVE      Imaging: [place] Renal  Result Date: 04/04/2016 CLINICAL DATA:  Acute renal failure EXAM: RENAL / URINARY TRACT ULTRASOUND COMPLETE COMPARISON:  CT scan 01/27/2011 FINDINGS: Right Kidney: Length: 10.5 cm. Echogenicity within normal limits. No mass or hydronephrosis visualized. Left Kidney: Length: 10.7 cm. Echogenicity within normal limits. No mass or hydronephrosis visualized. Bladder: Appears normal for degree of bladder distention. Bilateral ureteral  jets demonstrated on color Doppler assessment. IMPRESSION: Normal urinary tract ultrasound. Electronically Signed   By: Kennith CenterEric  Mansell M.D.   On: 04/04/2016 09:33   Dg Chest Port 1 View  Result Date: 04/04/2016 CLINICAL DATA:  Leukocytosis.  Drug use in the last 24 hours. EXAM: PORTABLE CHEST 1 VIEW COMPARISON:  11/26/2015 FINDINGS: The heart size and mediastinal contours are within normal limits. Both lungs are clear. The visualized skeletal structures are unremarkable. IMPRESSION: No active disease. Electronically Signed   By: Burman NievesWilliam  Stevens M.D.    On: 04/04/2016 05:14     Medications:     . heparin  5,000 Units Subcutaneous Q8H  . sodium chloride flush  3 mL Intravenous Q12H   acetaminophen **OR** acetaminophen, ondansetron **OR** ondansetron (ZOFRAN) IV, senna-docusate, sodium chloride flush, traMADol  Assessment/ Plan:  40 y.o. male with a PMHX of with a PMHx of GERD and hypertension, who [facility] for severe acute renal failure after ingestion of Aleve PM.   1.  Acute renal failure secondary to ingestion of 50 aleve pills. 2.  Leukocytosis, > reactive. 3.  Proteinuria, likely tubular in origin. 4.  Hypokalemia.  Plan: As before the patient took 50 Aleve PM pills on Wednesday. His creatinine was greater than 4 upon presentation. However his renal function has now normalized. He has good urine output as well. Counseled the patient that he should avoid taking so much Aleve in the future. He verbalized understanding of this. Given the fact that renal function has normalized no further renal input necessary. Therefore we will sign off. Disposition per hospitalist and psychiatry.   LOS: 1 Ellenor Wisniewski 8/19/201710:18 AM

## 2016-04-05 NOTE — Progress Notes (Signed)
Pt was admitted to Behavior Med. With the Dx. Of Major Depression with SI.: According to report : Carl Parrish is an 40 y.o. male who presents to the ER seeking help for his substance use. He admits to using; cocaine, alcohol and cannabis. While in the ER, he reported he took an overdose of Aleve, this past Wednesday (04/02/2016). He states he was having SI and didn't "give a fuck about my life." Per his report, his family stated he "wasn't acting right." He was naked and trying to fight people. Patient did not seek medical attention after the attempt. During the interview process,Pt admitted to use cocaine but denied using Etoh or other drugs. Pt's mood and affect was depressed with frequent crying spells. Pt denies SI and A/V hallucinations. Pt is able to contract for safety. Stripe search completed, no contraband found. Skin assessment completed which was negative. Will continue to observe and maintain a safe  Environment.

## 2016-04-05 NOTE — Progress Notes (Signed)
[facility] has been medically cleared [facility]. Sitter 1:1 with suicide precautions continued. Chronic back pain required tramadol and tylenol. See AVS.

## 2016-04-05 NOTE — Progress Notes (Signed)
[facility] - [place] [facility]   PATIENT NAME: Carl BurowsMark Schoenfeld    MR#:  [id number]  DATE OF BIRTH:  06/02/1976  SUBJECTIVE:  Came in after overdosing on 50 tabs of aleve. Found to be in renal failure He had used IV cocaine also. Crying spells intermittent  REVIEW OF SYSTEMS:   Review of Systems  Constitutional: Negative for chills, fever and weight loss.  HENT: Negative for ear discharge, ear pain and nosebleeds.   Eyes: Negative for blurred vision, pain and discharge.  Respiratory: Negative for sputum production, shortness of breath, wheezing and stridor.   Cardiovascular: Negative for chest pain, palpitations, orthopnea and PND.  Gastrointestinal: Negative for abdominal pain, diarrhea, nausea and vomiting.  Genitourinary: Negative for frequency and urgency.  Musculoskeletal: Negative for back pain and joint pain.  Neurological: Negative for sensory change, speech change, focal weakness and weakness.  Psychiatric/Behavioral: Positive for depression, substance abuse and suicidal ideas. Negative for hallucinations. The patient is not nervous/anxious.    Tolerating Diet: Tolerating PT:   DRUG ALLERGIES:  No Known Allergies  VITALS:  Blood pressure (!) 142/86, pulse 60, temperature 97.8 F (36.6 C), temperature source Oral, resp. rate 18, height 5\' 6"  (1.676 m), weight 158 lb 8 oz (71.9 kg), SpO2 100 %.  PHYSICAL EXAMINATION:   Physical Exam  GENERAL:  40 y.o.-year-old patient lying in the bed with no acute distress.  EYES: Pupils equal, round, reactive to light and accommodation. No scleral icterus. Extraocular muscles intact.  HEENT: Head atraumatic, normocephalic. Oropharynx and nasopharynx clear.  NECK:  Supple, no jugular venous distention. No thyroid enlargement, no tenderness.  LUNGS: Normal breath sounds bilaterally, no wheezing, rales, rhonchi. No use of accessory muscles of respiration.  CARDIOVASCULAR: S1, S2 normal. No murmurs, rubs, or  gallops.  ABDOMEN: Soft, nontender, nondistended. Bowel sounds present. No organomegaly or mass.  EXTREMITIES: No cyanosis, clubbing or edema b/l.    NEUROLOGIC: Cranial nerves II through XII are intact. No focal Motor or sensory deficits b/l.   PSYCHIATRIC:  patient is alert and oriented x 3 depressed, crying.  SKIN: No obvious rash, lesion, or ulcer.   LABORATORY PANEL:  CBC  Recent Labs Lab 04/04/16 0420  WBC 33.3*  HGB 16.3  HCT 48.0  PLT 334    Chemistries   Recent Labs Lab 04/04/16 0420 04/05/16 0737  NA 136 137  K 3.1* 4.6  CL 100* 113*  CO2 21* 21*  GLUCOSE 172* 94  BUN 24* 16  CREATININE 4.85* 0.75  CALCIUM 9.5 8.1*  AST 74*  --   ALT 66*  --   ALKPHOS 71  --   BILITOT 0.9  --    Cardiac Enzymes No results for input(s): TROPONINI in the last 168 hours. RADIOLOGY:  [place] Renal  Result Date: 04/04/2016 CLINICAL DATA:  Acute renal failure EXAM: RENAL / URINARY TRACT ULTRASOUND COMPLETE COMPARISON:  CT scan 01/27/2011 FINDINGS: Right Kidney: Length: 10.5 cm. Echogenicity within normal limits. No mass or hydronephrosis visualized. Left Kidney: Length: 10.7 cm. Echogenicity within normal limits. No mass or hydronephrosis visualized. Bladder: Appears normal for degree of bladder distention. Bilateral ureteral jets demonstrated on color Doppler assessment. IMPRESSION: Normal urinary tract ultrasound. Electronically Signed   By: Kennith CenterEric  Mansell M.D.   On: 04/04/2016 09:33   Dg Chest Port 1 View  Result Date: 04/04/2016 CLINICAL DATA:  Leukocytosis.  Drug use in the last 24 hours. EXAM: PORTABLE CHEST 1 VIEW COMPARISON:  11/26/2015 FINDINGS: The heart size and  mediastinal contours are within normal limits. Both lungs are clear. The visualized skeletal structures are unremarkable. IMPRESSION: No active disease. Electronically Signed   By: Burman NievesWilliam  Stevens M.D.   On: 04/04/2016 05:14   ASSESSMENT AND PLAN:  40 year old male patient with history of GERD, hypertension,  substance abuse presented to the emergency room with anxiety.  1. 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 Dehydration -resolved  3. Leukocytosis secondary to stress -no source of infection  4. Suicide attempt under IVC   5. Depression, new onset Appreciate Dr clapacs input. Will await Dr Micki Rileyfaheem's input  6  hypokalemia Resolved  Pt is medically ready for d/c. Await further psych recommendations. Pt still feels very depressed and having crying spells  Case discussed with Care Management/Social Worker. Management plans discussed with the patient, family and they are in agreement.  CODE STATUS:full  DVT Prophylaxis: lovenox  TOTAL TIME TAKING CARE OF THIS PATIENT: 30 minutes.  >50% time spent on counselling and coordination of care  Medically ready for d/c  Note: This dictation was prepared with Dragon dictation along with smaller phrase technology. Any transcriptional errors that result from this process are unintentional.  Denika Krone M.D on 04/05/2016 at 8:53 AM  Between 7am to 6pm - Pager - [phone]  After 6pm go to [email or web address] - password [email or web address] [facility]  [facility]  Office  [phone]  CC: Primary care physician; No PCP Per Patient
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
## 2016-04-05 NOTE — Progress Notes (Signed)
1730 Policeman here with Behavioral Med ready to accept pt. Policemen and [employer] CNA sitter 1:1 escorted [facility]. Discharged.

## 2016-04-05 NOTE — Progress Notes (Signed)
Pt requested additional pain medication for chronic back pain. TC with Dr. Allena KatzPatel with no new orders- use tylenol as ordered. Pt updated; he declined tylenol.  Saline lock displaced with ? Etiology with MD order received could leave out. Awaiting bed assignment in behavioral med and will transfer at that time.

## 2016-04-05 NOTE — Progress Notes (Signed)
D: Patient is alert and oriented on the unit this shift. Patient not attended and   participated in groups today. Patient denies suicidal ideation, homicidal ideation, auditory or visual hallucinations at the present time.  A: Scheduled medications are administered to patient as per MD orders. Emotional support and encouragement are provided. Patient is maintained on q.15 minute safety checks. Patient is informed to notify staff with questions or concerns. R: No adverse medication reactions are noted. Patient is cooperative with medication administration . Patient is receptive,  anxious and cooperative on the unit at this time. Patient does not interact with others on the unit this shift. Patient contracts for safety at this time. Patient remains safe at this time.

## 2016-04-06 DIAGNOSIS — F17200 Nicotine dependence, unspecified, uncomplicated: Secondary | ICD-10-CM | POA: Diagnosis present

## 2016-04-06 DIAGNOSIS — F322 Major depressive disorder, single episode, severe without psychotic features: Principal | ICD-10-CM

## 2016-04-06 NOTE — Progress Notes (Signed)
D: Patient alert and oriented x 4. Patient denies pain/SI/HI/AVH. Patient qustion when possible discharge. This Clinical research associatewriter advised patient that he will need to speak with MD about discharge. Patient states he feels like he is ready for discharge.  A: Staff to monitor Q 15 mins for safety. Encouragement and support offered. Scheduled medications administered per orders. R: Patient remains safe on the unit. Patient attended group tonight. Patient visible on hte unit and interacting with peers. Patient taking administered medications.

## 2016-04-06 NOTE — Progress Notes (Addendum)
Patient with sad affect, anxious behavior. Cooperative with meals, meds and plan of care. PRN Atarax 25 mg po for anxiety with good effect. Patient recent admit, noted restless this am and stating " I want to discharge when will doctor be here". Extended one on one to discuss issues rt admission. Patient with some insight into overdose. Good eye contact and verbalizes thoughts and emotions. No SI/HI at this time. States overdose "was a mistake". Poor coping skills. Reports good job with family members but states "I pushed everyone away". Therapy groups encouraged, safety maintained. Meets with MD.

## 2016-04-06 NOTE — [facility] Group Notes (Signed)
[facility] Group Notes:  (Nursing/MHT/Case Management/Adjunct)  Date:  04/06/2016  Time:  12:48 AM  Type of Therapy:  Psychoeducational Skills  Participation Level:  Did Not Attend   Summary of Progress/Problems:  Carl MilroyLaquanda Y Wilson Parrish 04/06/2016, 12:48 AM
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
## 2016-04-06 NOTE — [facility] Group Notes (Signed)
[facility] LCSW Group Therapy  04/06/2016 2:10 PM  Type of Therapy:  Group Therapy  Participation Level:  Pt did not attend group. CSW invited pt to group.   Summary of Progress/Problems: Coping Skills: Patients defined and discussed healthy coping skills. Patients identified healthy coping skills they would like to try during hospitalization and after discharge. CSW offered insight to varying coping skills that may have been new to patients such as practicing mindfulness  Tineka Uriegas G. Garnette CzechSampson MSW, LCSWA 04/06/2016, 2:10 PM
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
## 2016-04-06 NOTE — Plan of Care (Signed)
Problem: Education: Goal: Knowledge of [place] [facility] Education information/materials will improve Outcome: Progressing Patient recent admit and wants to discharge. Discussed situation rt admission and educated on current plan of care.

## 2016-04-07 LAB — MYOGLOBIN, URINE: Myoglobin, Ur: 4 ng/mL (ref 0–13)

## 2016-04-07 MED ORDER — HYDROXYZINE HCL 25 MG PO TABS: 25.0000 mg | ORAL_TABLET | Freq: Three times a day (TID) | ORAL | 1 refills | Status: DC | PRN

## 2016-04-07 MED ORDER — MIRTAZAPINE 15 MG PO TABS: 15.0000 mg | ORAL_TABLET | Freq: Every day | ORAL | 1 refills | Status: AC

## 2016-04-07 MED ORDER — MIRTAZAPINE 15 MG PO TABS: 15.0000 mg | ORAL_TABLET | Freq: Every day | ORAL | Status: DC

## 2016-04-07 MED ORDER — TRAZODONE HCL 50 MG PO TABS: 50.0000 mg | ORAL_TABLET | Freq: Every evening | ORAL | 1 refills | Status: AC | PRN

## 2016-04-07 MED ORDER — TRAZODONE HCL 50 MG PO TABS: 50.0000 mg | ORAL_TABLET | Freq: Every evening | ORAL | 1 refills | Status: DC | PRN

## 2016-04-07 MED ORDER — HYDROXYZINE HCL 25 MG PO TABS: 25.0000 mg | ORAL_TABLET | Freq: Three times a day (TID) | ORAL | 1 refills | Status: AC | PRN

## 2016-04-07 MED ORDER — MIRTAZAPINE 15 MG PO TABS: 15.0000 mg | ORAL_TABLET | Freq: Every day | ORAL | 1 refills | Status: DC

## 2016-04-07 NOTE — Progress Notes (Signed)
Patient denies SI/HI, denies A/V hallucinations. Patient verbalizes understanding of discharge instructions, follow up care and prescriptions. Patient given all belongings from  locker. Patient escorted out by staff, transported by family. 

## 2016-04-07 NOTE — BHH Group Notes (Signed)
B[facility] Group Notes:  (Nursing/MHT/Case Management/Adjunct)  Date:  04/07/2016  Time:  1:30 AM  Type of Therapy:  Group Therapy  Participation Level:  Did Not Attend    Veva Holesshley Imani Jarrod Bodkins 04/07/2016, 1:30 AM

## 2016-04-07 NOTE — BHH Suicide Risk Assessment (Signed)
B[facility] INPATIENT:  Family/Significant Other Suicide Prevention Education  Suicide Prevention Education:  Patient Refusal for Family/Significant Other Suicide Prevention Education: The patient Carl Parrish has refused to provide written consent for family/significant other to be provided Family/Significant Other Suicide Prevention Education during admission and/or prior to discharge.  Physician notified. Pt refused SPE from the CSW.  Carl Parrish 04/07/2016, 1:57 PM

## 2016-04-07 NOTE — Discharge Summary (Signed)
Physician Discharge Summary Note  Patient:  Carl Parrish is an 40 y.o., male MRN:  [id number] DOB:  03/17/1976 Patient phone:  [phone] (home)  Patient address:   [address] [place] [address],  Total Time spent with patient: 30 minutes  Date of Admission:  04/05/2016 Date of Discharge: 04/07/2016  Reason for Admission: Suicide attempt.  Patient is a 40 year old male who presented to the hospital after he overdosed on Aleve tablets on Wednesday. He reported that he was feeling extremely depressed down for the past couple of days. He reported that he has not felt like this. Patient reported that he was feeling depressed and was sleeping poorly. He also uses IV cocaine in the middle of the week. He has relapsed on cocaine recently. Patient reported that he took approximately 50 Aleve pills and then presented to the hospital. Patient was admitted to the medical floor and was transferred after he became medically stable.   During my interview patient reported that he has improved while he was in the medical floor. He reported that he was using drugs including cocaine and alcohol. He stated that he has noticed improvement in his depression. Currently he is not using any medications. He slept well last night. He appeared somewhat hyper during the interview. He currently denied having any suicidal ideations or plans and wants to be discharged soon.   Associated Signs/Symptoms: Depression Symptoms:  depressed mood, hypersomnia, psychomotor retardation, fatigue, feelings of worthlessness/guilt, hopelessness, anxiety, panic attacks, disturbed sleep, (Hypo) Manic Symptoms:  Impulsivity, Irritable Mood, Labiality of Mood, Anxiety Symptoms:  Excessive Worry, Psychotic Symptoms:  Paranoia, PTSD Symptoms: Negative  Past Psychiatric History:  never seen a psychiatrist or therapist before. Denies history ofpsychiatric hospitalization. Never been prescribed any psychiatric medicine. He says  that he has had thoughts about suicide in the past but has never done anything like this to hurt himself previously.  Family Psychiatric  History: sister- uses heroine.    Social History: He lives with his aunt. 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 No health insurance.   Principal Problem: Major depressive disorder, single episode, severe [facility](HCC) Discharge Diagnoses: Patient Active Problem List   Diagnosis Date Noted  . Tobacco use disorder [F17.200] 04/06/2016  . Major depressive disorder, single episode, severe (HCC) [F32.2] 04/05/2016  . Acute renal failure (ARF) (HCC) [N17.9] 04/04/2016  . Severe recurrent major depression without psychotic features (HCC) [F33.2] 04/04/2016  . Suicide attempt (HCC) [T14.91] 04/04/2016  . Cocaine use disorder, moderate, dependence (HCC) [F14.20] 04/04/2016  . Substance induced mood disorder [facility](HCC) [F19.94] 04/04/2016   Past Medical History:  Past Medical History:  Diagnosis Date  . GERD (gastroesophageal reflux disease)   . Hypertension     Past Surgical History:  Procedure Laterality Date  . CHOLECYSTECTOMY     Family History:  Family History  Problem Relation Age of Onset  . Arthritis Mother    Social History:  History  Alcohol Use  . Yes     History  Drug Use  . Types: Marijuana, Cocaine    Social History   Social History  . Marital status: Single    Spouse name: N/A  . Number of children: N/A  . Years of education: N/A   Occupational History  . tree cutter    Social History Main Topics  . Smoking status: Light Tobacco Smoker  . Smokeless tobacco: Never Used  . Alcohol use Yes  . Drug use:     Types: Marijuana, Cocaine  . Sexual activity:  Not Asked   Other Topics Concern  . None   Social History Narrative  . None    Hospital Course:    Carl Parrish is a 40 year old male with no past psychiatric history admitted after suicide attempt by medication overdose in the context of social stressors and substance use.  1.  Suicidal ideation. The patient denies any thoughts, intentions, or plans to hurt himself or others. He is able to contract for safety. He is forward thinking and optimistic about the future.  2. Mood. We started Lamictal for depression.  3. Anxiety. He responded well to Vistaril.  4. Insomnia. Trazodone was available.  5. Substance abuse treatment. The patient minimizes his problems and declines treatment.  6. Smoking. The patient is not interested in smoking cessation.   7. Disposition. He was discharged to home with family. He will follow up with [facility].  Physical Findings: AIMS: Facial and Oral Movements Muscles of Facial Expression: None, normal Lips and Perioral Area: None, normal Jaw: None, normal Tongue: None, normal,Extremity Movements Upper (arms, wrists, hands, fingers): None, normal Lower (legs, knees, ankles, toes): None, normal, Trunk Movements Neck, shoulders, hips: None, normal, Overall Severity Severity of abnormal movements (highest score from questions above): None, normal Incapacitation due to abnormal movements: None, normal Patient's awareness of abnormal movements (rate only patient's report): No Awareness, Dental Status Current problems with teeth and/or dentures?: No Does patient usually wear dentures?: No  CIWA:    COWS:     Musculoskeletal: Strength & Muscle Tone: within normal limits Gait & Station: normal Patient leans: N/A  Psychiatric Specialty Exam: Physical Exam  Nursing note and vitals reviewed.   Review of Systems  Psychiatric/Behavioral: Positive for substance abuse. The patient is nervous/anxious.   All other systems reviewed and are negative.   Blood pressure 121/79, pulse 76, temperature 98.7 F (37.1 C), temperature source Oral, resp. rate 18, height 5\' 6"  (1.676 m), weight 74.4 kg (164 lb), SpO2 100 %.Body mass index is 26.47 kg/m.  See SRA.                                                  Sleep:  Number of  Hours: 8        Has this patient used any form of tobacco in the last 30 days? (Cigarettes, Smokeless Tobacco, Cigars, and/or Pipes) Yes, Yes, A prescription for an FDA-approved tobacco cessation medication was offered at discharge and the patient refused  Blood Alcohol level:  Lab Results  Component Value Date   ETH <5 04/04/2016    Metabolic Disorder Labs:  No results found for: HGBA1C, MPG No results found for: PROLACTIN No results found for: CHOL, TRIG, HDL, CHOLHDL, VLDL, LDLCALC  See Psychiatric Specialty Exam and Suicide Risk Assessment completed by Attending Physician prior to discharge.  Discharge destination:  Home  Is patient on multiple antipsychotic therapies at discharge:  No   Has Patient had three or more failed trials of antipsychotic monotherapy by history:  No  Recommended Plan for Multiple Antipsychotic Therapies: NA  Discharge Instructions    Diet - low sodium heart healthy    Complete by:  As directed   Increase activity slowly    Complete by:  As directed       Medication List    TAKE these medications     Indication  hydrOXYzine 25  MG tablet Commonly known as:  ATARAX/VISTARIL Take 1 tablet (25 mg total) by mouth 3 (three) times daily as needed for anxiety.  Indication:  Anxiety Neurosis   mirtazapine 15 MG tablet Commonly known as:  REMERON Take 1 tablet (15 mg total) by mouth at bedtime.  Indication:  Major Depressive Disorder   traZODone 50 MG tablet Commonly known as:  DESYREL Take 1 tablet (50 mg total) by mouth at bedtime as needed for sleep.  Indication:  Trouble Sleeping      Follow-up Information    [facility]. Go in 3 day(s).   Why:  Follow-up care will be provided [facility]. Walk-in hours for new patients is M-F 8am-2:30pm. [facility] will provided outpatient services for therapy and psychiatric medication management. Please go to [facility] within 3 days of discharge.  Contact information: [address] [place] [address] [phone]           Follow-up recommendations:  Activity:  As tolerated. Diet:  Regular. Other:  keep follow-up appointments.  Comments:    Signed: Kristine LineaJolanta Demetria Iwai, MD 04/07/2016, 10:53 AM

## 2016-04-07 NOTE — BHH Suicide Risk Assessment (Signed)
[facility]   Principal Problem: Major depressive disorder, single episode, severe [facility](HCC) Discharge Diagnoses:  Patient Active Problem List   Diagnosis Date Noted  . Tobacco use disorder [F17.200] 04/06/2016  . Major depressive disorder, single episode, severe (HCC) [F32.2] 04/05/2016  . Acute renal failure (ARF) (HCC) [N17.9] 04/04/2016  . Severe recurrent major depression without psychotic features (HCC) [F33.2] 04/04/2016  . Suicide attempt (HCC) [T14.91] 04/04/2016  . Cocaine use disorder, moderate, dependence (HCC) [F14.20] 04/04/2016  . Substance induced mood disorder (HCC) [F19.94] 04/04/2016    Total Time spent with patient: 30 minutes  Musculoskeletal: Strength & Muscle Tone: within normal limits Gait & Station: normal Patient leans: N/A  Psychiatric Specialty Exam: Review of Systems  Psychiatric/Behavioral: The patient is nervous/anxious.   All other systems reviewed and are negative.   Blood pressure 121/79, pulse 76, temperature 98.7 F (37.1 C), temperature source Oral, resp. rate 18, height 5\' 6"  (1.676 m), weight 74.4 kg (164 lb), SpO2 100 %.Body mass index is 26.47 kg/m.  General Appearance: Casual  Eye Contact::  Good  Speech:  Clear and Coherent409  Volume:  Normal  Mood:  Anxious  Affect:  Appropriate  Thought Process:  Goal Directed  Orientation:  Full (Time, Place, and Person)  Thought Content:  WDL  Suicidal Thoughts:  No  Homicidal Thoughts:  No  Memory:  Immediate;   Fair Recent;   Fair Remote;   Fair  Judgement:  Impaired  Insight:  Shallow  Psychomotor Activity:  Normal  Concentration:  Fair  Recall:  FiservFair  Fund of Knowledge:Fair  Language: Fair  Akathisia:  No  Handed:  Right  AIMS (if indicated):     Assets:  Communication Skills Desire for Improvement Housing Physical Health Resilience Social Support Talents/Skills Transportation Vocational/Educational  Sleep:  Number of Hours: 8  Cognition: WNL   ADL's:  Intact   Mental Status Per Nursing Assessment::   On Admission:     Demographic Factors:  Male and Caucasian  Loss Factors: NA  Historical Factors: Impulsivity  Risk Reduction Factors:   Sense of responsibility to family, Employed, Living with another person, especially a relative and Positive social support  Continued Clinical Symptoms:  Depression:   Comorbid alcohol abuse/dependence Impulsivity Alcohol/Substance Abuse/Dependencies  Cognitive Features That Contribute To Risk:  None    Suicide Risk:  Minimal: No identifiable suicidal ideation.  Patients presenting with no risk factors but with morbid ruminations; may be classified as minimal risk based on the severity of the depressive symptoms  Follow-up Information    [facility]. Go in 3 day(s).   Why:  [facility]. Walk-in hours for new patients is M-F 8am-2:30pm. [facility] will provided outpatient services for therapy and psychiatric medication management. Please go to [facility] within 3 days of discharge.  Contact information: [address] [place] [address] [phone]           Plan Of Care/Follow-up recommendations:  Activity:  As tolerated. Diet:  Regular. Other:  Keep follow-up appointments.  Kristine LineaJolanta Bettejane Leavens, MD 04/07/2016, 10:50 AM

## 2016-04-07 NOTE — Tx Team (Signed)
Pt was admitted after the previous tx team mtg that the CSW was present for (on 04/03/16) and before the next scheduled tx meeting (on 04/08/16) and as such, a tx team note was not needed.    Dorothe PeaJonathan F. Jonita Hirota, LCSWA, LCAS  04/07/16

## 2016-04-07 NOTE — Progress Notes (Signed)
  [facility] Discharge Plan :  Will you be returning to the same living situation after discharge:  Yes,  pt will be [place] to [place] At discharge, do you have transportation home?: Yes,  pt will be picked up by his aunt Do you have the ability to pay for your medications: Yes,  pt will be provided with prescriptions at discharge  Release of information consent forms completed and in the chart;  Patient's signature needed at discharge.  Patient to Follow up at: Follow-up Information    [facility]. Go in 3 day(s).   Why:  Follow-up care [facility]. Walk-in hours for new patients is M-F 8am-2:30pm. [facility] will provided outpatient services for, substance abuse treatment, therapy and psychiatric medication management.  Contact information: [address] [place] [address] [phone]           Next level of care provider has access to [facility] Link:no  Safety Planning and Suicide Prevention discussed: No. Pt refused SPE from the CSW     Has patient been referred to the Quitline?: Patient refused referral  Patient has been referred for addiction treatment: Yes  Dorothe PeaJonathan F Ledonna Dormer 04/07/2016, 1:16 PM

## 2016-04-07 NOTE — BHH Group Notes (Signed)
BHH LCSW Group Therapy   04/07/2016 9:30am Type of Therapy: Group Therapy   Participation Level: Patient invited but did not attend.  Participation Quality: Patient invited but did not attend.   Summary of Progress/Problems: Pt identified obstacles faced currently and processed barriers involved in overcoming these obstacles. Pt identified steps necessary for overcoming these obstacles and explored motivation (internal and external) for facing these difficulties head on. Pt further identified one area of concern in their lives and chose a goal to focus on for today.   Lynden OxfordKadijah R. Martinique Pizzimenti, LCSWA

## 2016-04-07 NOTE — Progress Notes (Signed)
Recreation Therapy Notes  Date: 08.21.17 Time: 1:00 pm Location: Craft Room  Group Topic: Self-expression  Goal Area(s) Addresses:  Patient will identify at least one item per dimension of health. Patient will examine areas they are deficient in.  Behavioral Response: Did not attend  Intervention: 6 Dimensions of Health  Activity: Patients were given a definition sheet of the 6 dimensions of health and a worksheet with each dimension on it. Patients were instructed to write 2-3 things they were currently doing in each dimension.  Education: LRT educated patients on ways they can improve each dimension.  Education Outcome: Patient did not attend group.  Clinical Observations/Feedback: Patient did not attend group.  Jacquelynn CreeGreene,Donnia Poplaski M, LRT/CTRS 04/07/2016 2:06 PM

## 2017-06-18 DEATH — deceased

## 2017-08-24 IMAGING — CR DG CHEST 2V
1 series · 2 of 2 positions shown · non-contrast
Comparison: June 10, 2013

CLINICAL DATA: Cough and congestion for 3 weeks with shortness of
breath

EXAM:
CHEST  2 VIEW

[Series 1: dg chest 2 view · 0.14mm/px · 2 of 2 slices shown]
[im 1/2]
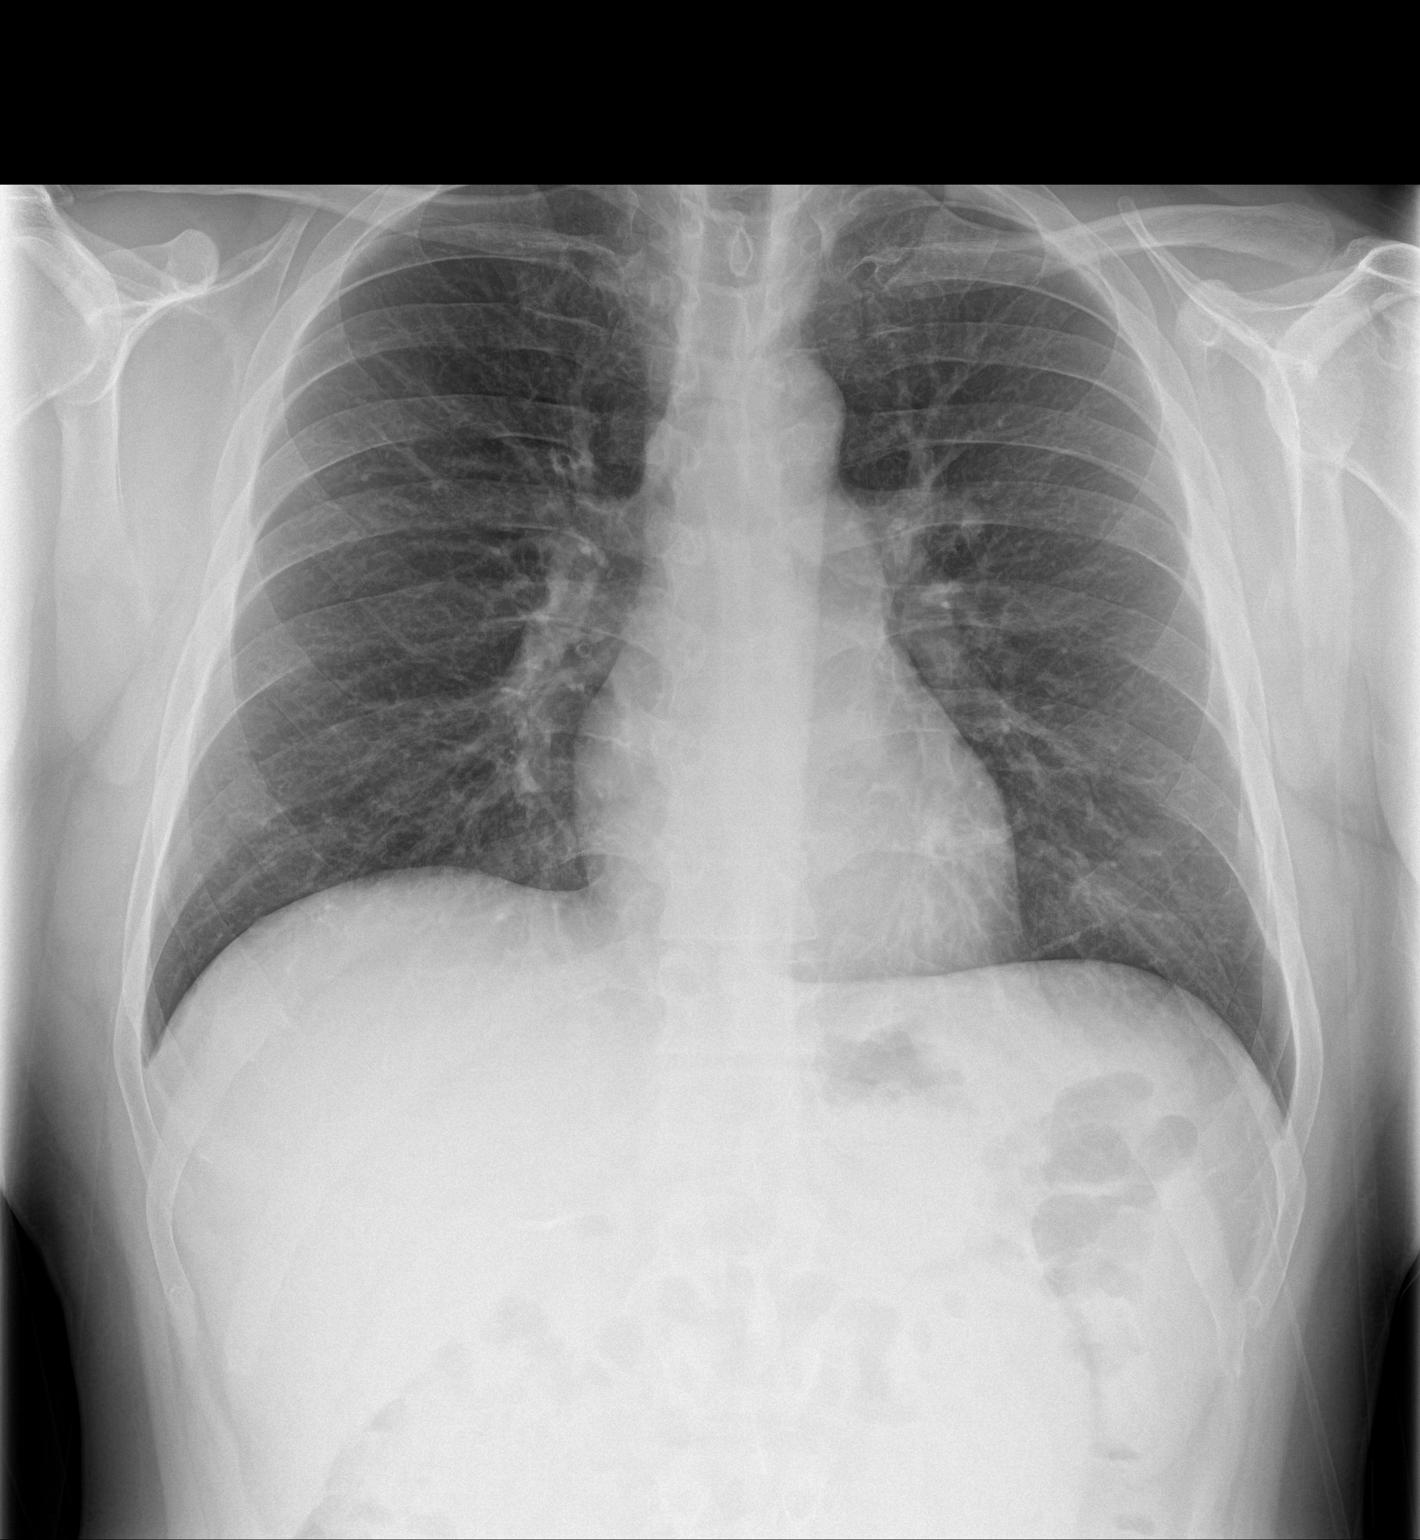
[im 2/2]
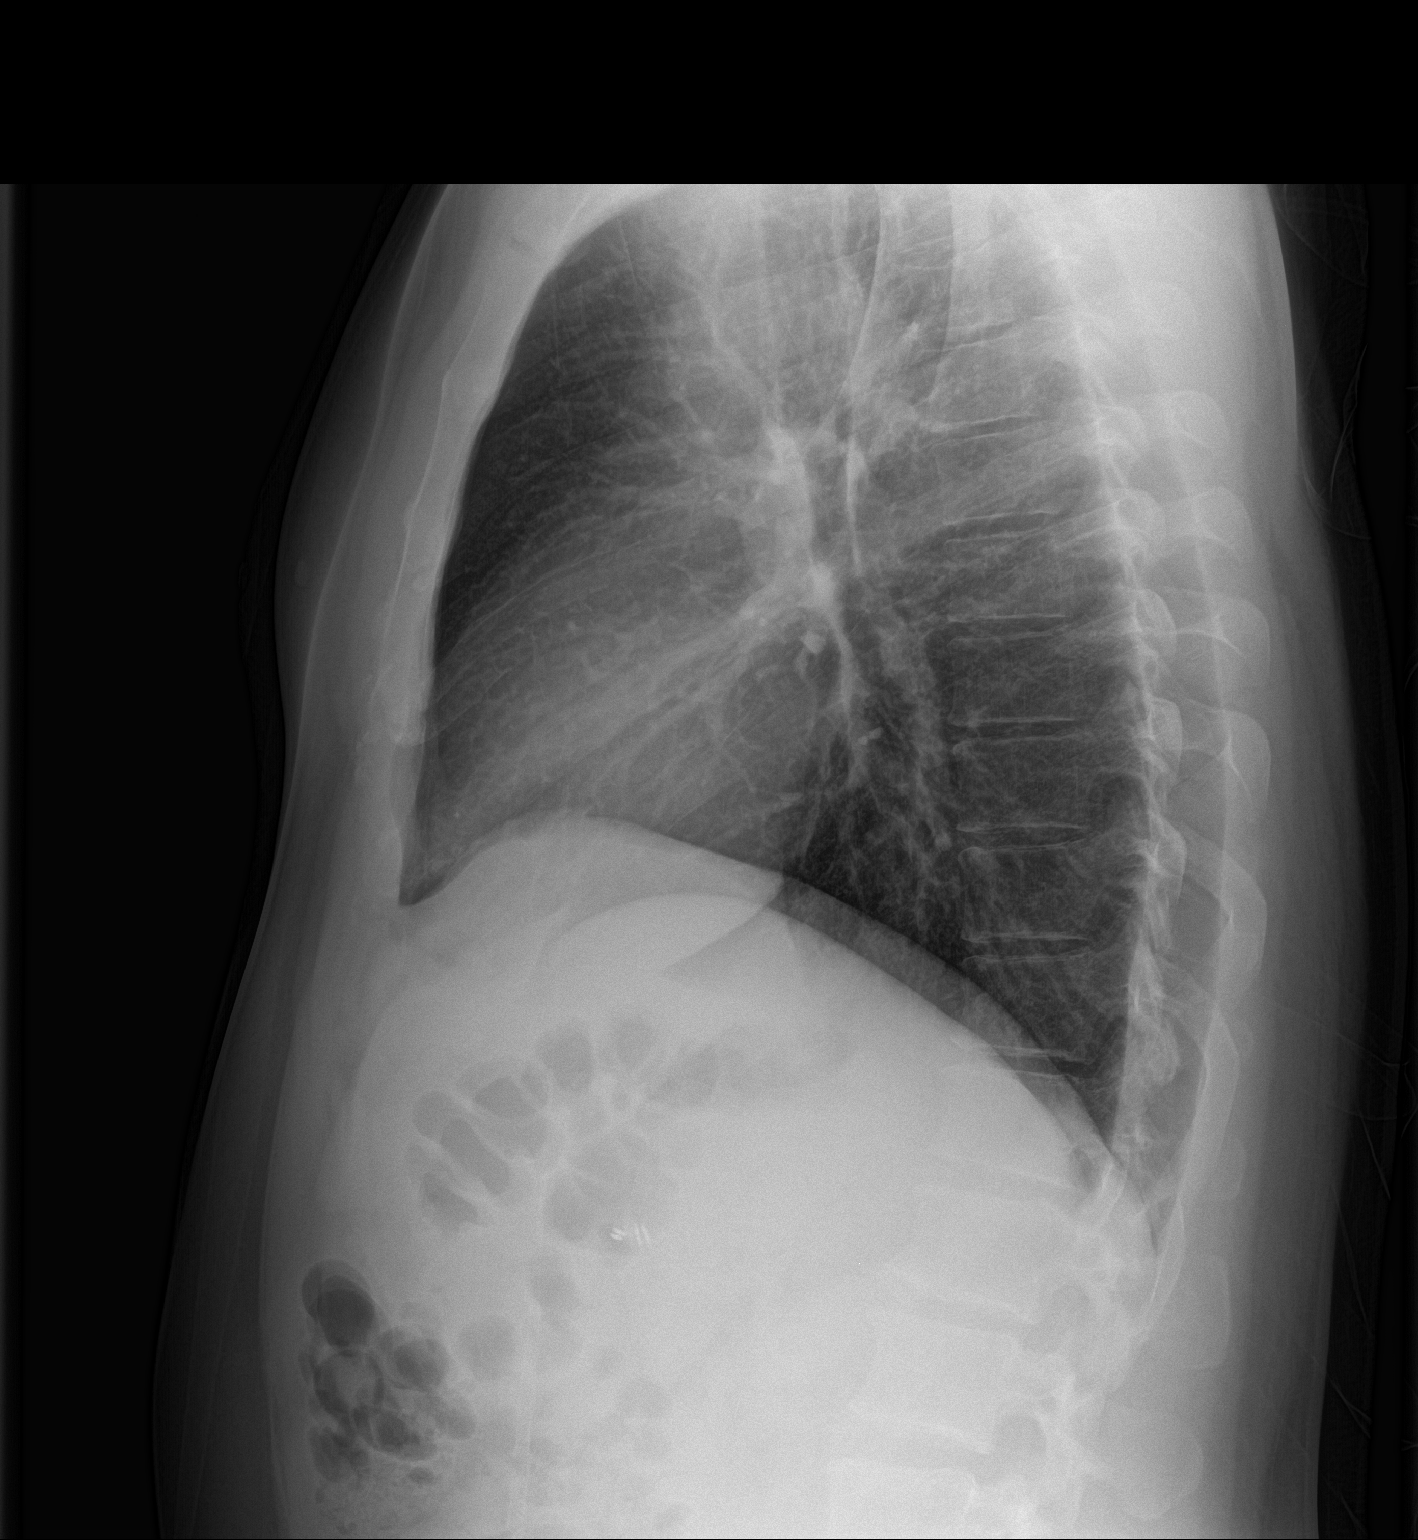

[2 of 2 positions shown; findings below may reference images not displayed]

FINDINGS: Lungs are clear. Heart size and pulmonary vascularity are normal. No
adenopathy. No bone lesions.
IMPRESSION: No edema or consolidation.

## 2018-04-22 IMAGING — US US RENAL
1 series · 14 of 25 positions shown · non-contrast
Comparison: CT scan 01/27/2011

CLINICAL DATA: Acute renal failure

EXAM:
RENAL / URINARY TRACT ULTRASOUND COMPLETE

[Series 1: us renal · 0.23mm/px · 14 of 29 slices shown]
[im 1/29]
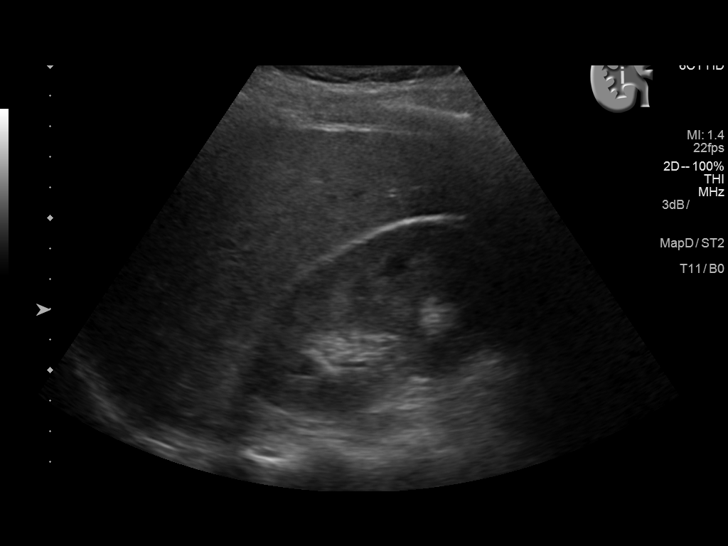
[im 3/29]
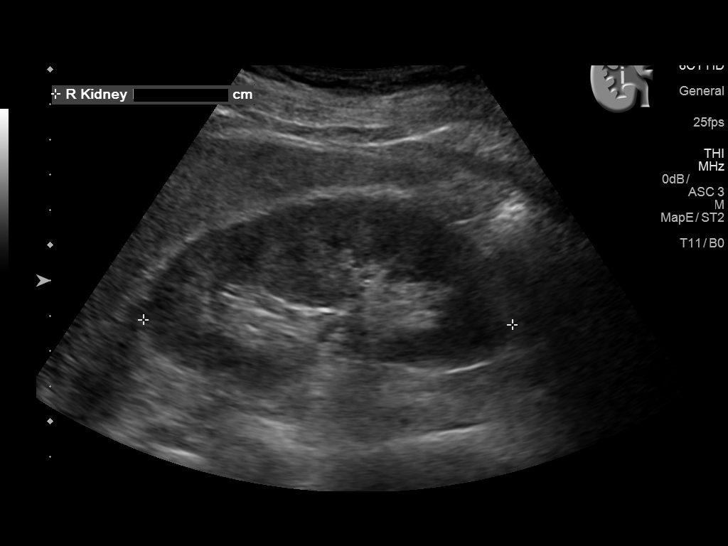
[im 5/29]
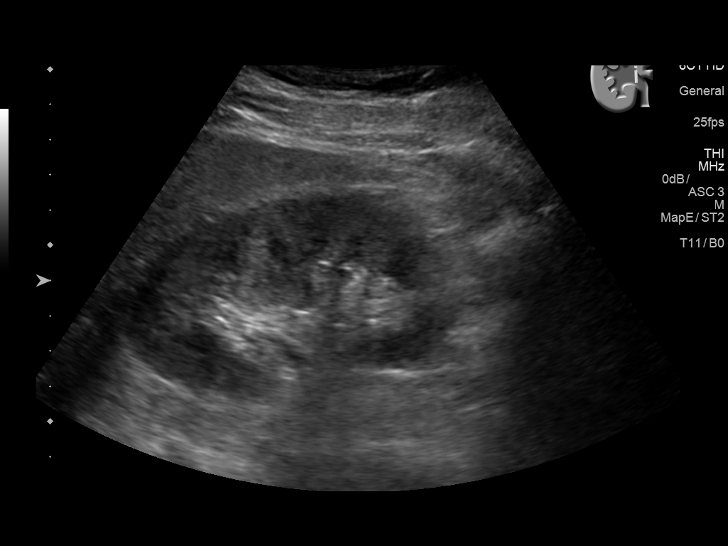
[im 8/29]
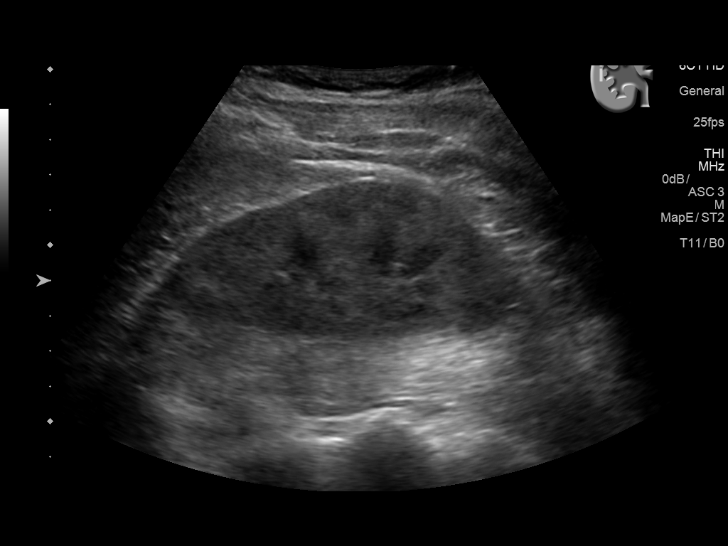
[im 10/29]
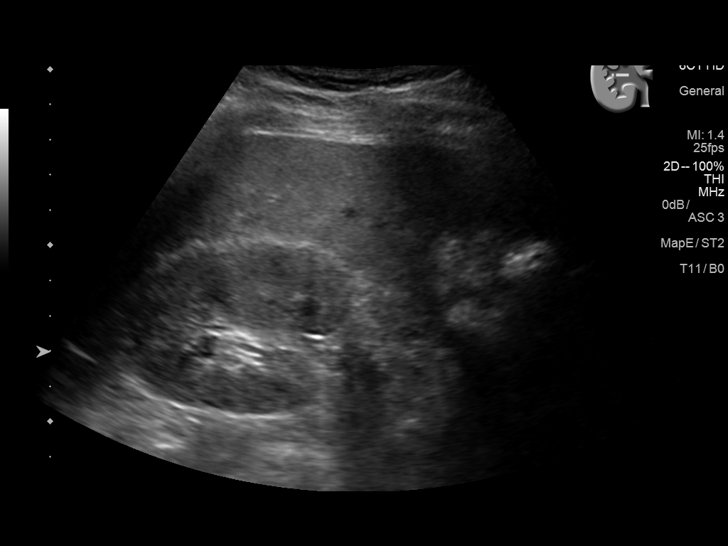
[im 11/29]
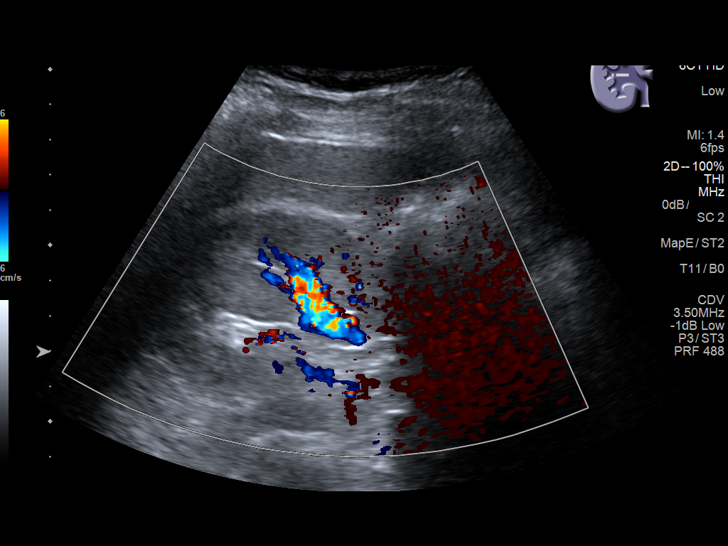
[im 13/29]
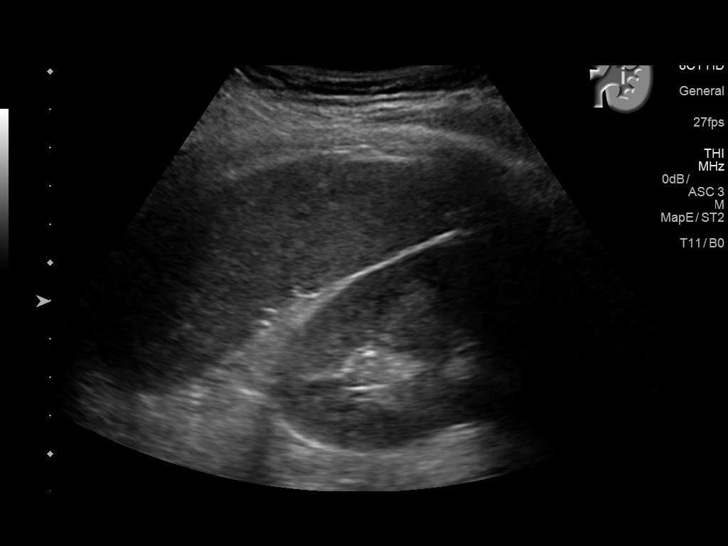
[im 16/29]
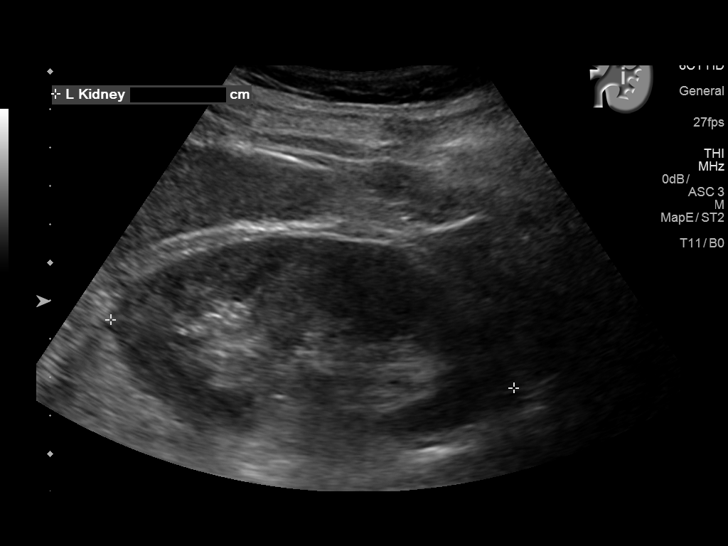
[im 18/29]
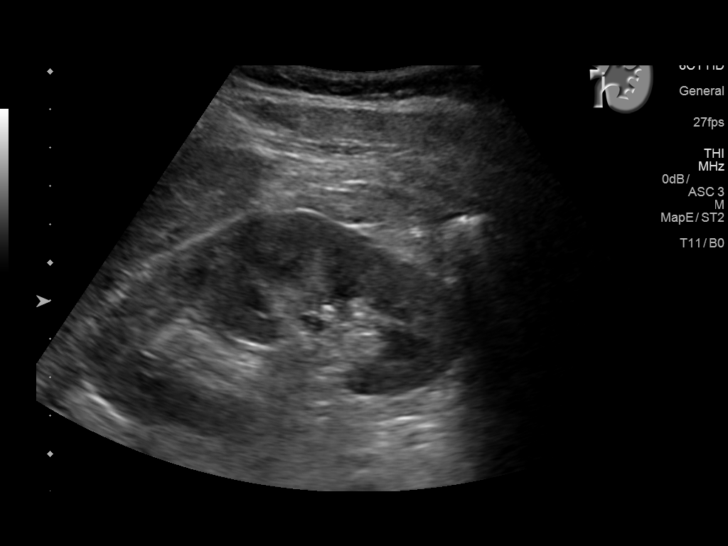
[im 19/29]
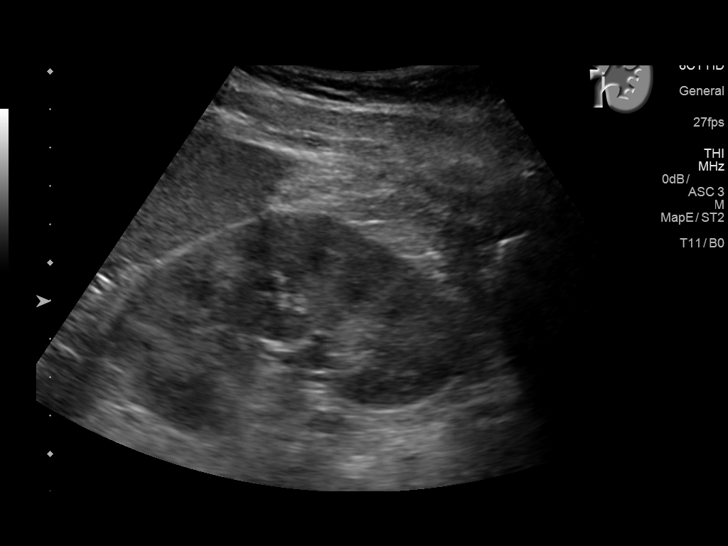
[im 22/29]
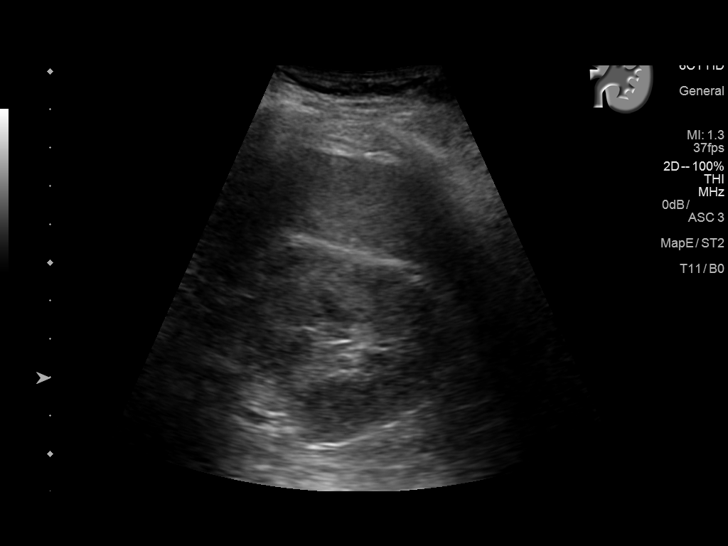
[im 24/29]
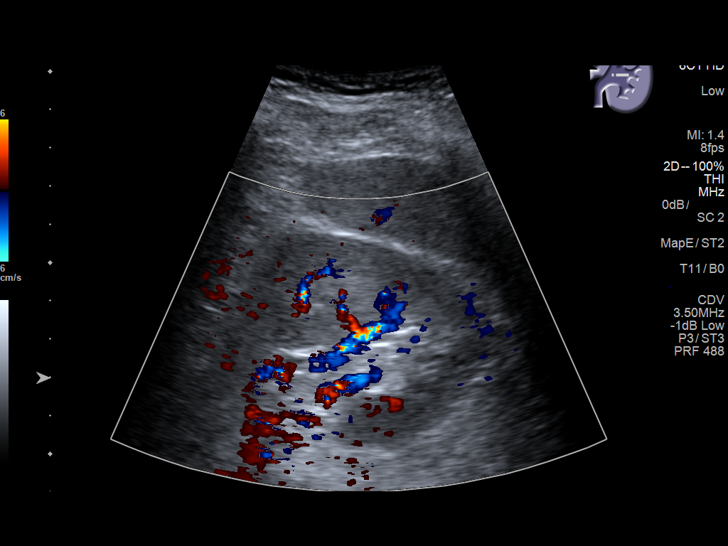
[im 26/29]
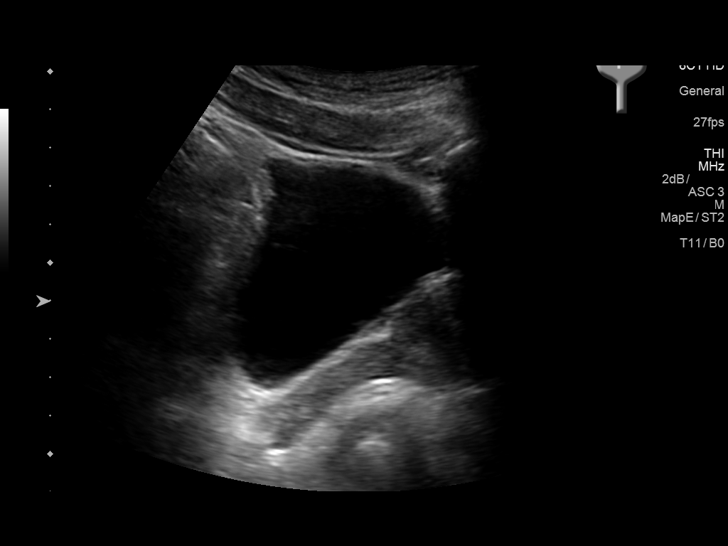
[im 29/29]
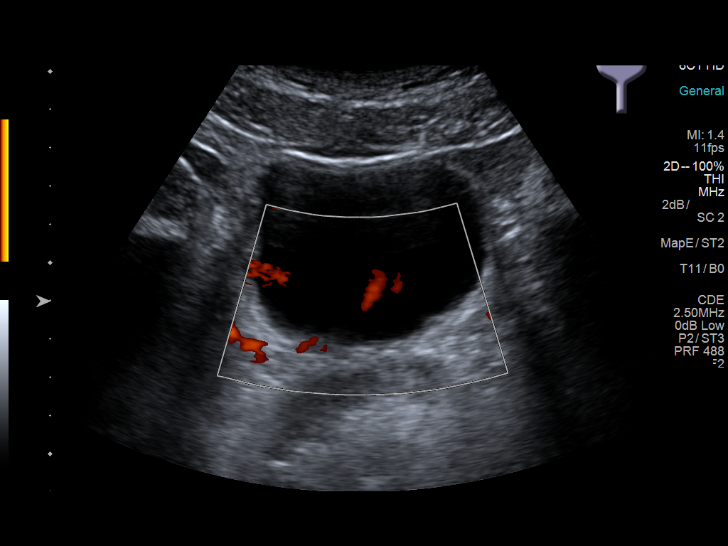

[14 of 25 positions shown; findings below may reference images not displayed]

FINDINGS: Right Kidney:

Length: 10.5 cm. Echogenicity within normal limits. No mass or
hydronephrosis visualized.

Left Kidney:

Length: 10.7 cm. Echogenicity within normal limits. No mass or
hydronephrosis visualized.

Bladder:

Appears normal for degree of bladder distention. Bilateral ureteral
jets demonstrated on color Doppler assessment.
IMPRESSION: Normal urinary tract ultrasound.
